# Patient Record
Sex: Male | Born: 1950 | Race: White | Hispanic: No | Marital: Married | Smoking: Current every day smoker
Health system: Southern US, Community
[De-identification: ages and names within clinical notes are randomized; demographics above are authoritative.]

## PROBLEM LIST (undated history)

## (undated) DIAGNOSIS — F1090 Alcohol use, unspecified, uncomplicated: Secondary | ICD-10-CM

## (undated) DIAGNOSIS — Z7289 Other problems related to lifestyle: Secondary | ICD-10-CM

## (undated) DIAGNOSIS — Z789 Other specified health status: Secondary | ICD-10-CM

## (undated) HISTORY — PX: BACK SURGERY: SHX140

---

## 2015-08-02 ENCOUNTER — Encounter (HOSPITAL_COMMUNITY): Payer: Self-pay | Admitting: *Deleted

## 2015-08-02 ENCOUNTER — Emergency Department (HOSPITAL_COMMUNITY): Payer: Medicare Other

## 2015-08-02 ENCOUNTER — Inpatient Hospital Stay (HOSPITAL_COMMUNITY)
Admission: EM | Admit: 2015-08-02 | Discharge: 2015-08-13 | DRG: 432 | Disposition: E | Payer: Medicare Other | Attending: Internal Medicine | Admitting: Internal Medicine

## 2015-08-02 DIAGNOSIS — Z720 Tobacco use: Secondary | ICD-10-CM | POA: Diagnosis not present

## 2015-08-02 DIAGNOSIS — C799 Secondary malignant neoplasm of unspecified site: Secondary | ICD-10-CM | POA: Diagnosis present

## 2015-08-02 DIAGNOSIS — R000 Tachycardia, unspecified: Secondary | ICD-10-CM | POA: Diagnosis present

## 2015-08-02 DIAGNOSIS — C3491 Malignant neoplasm of unspecified part of right bronchus or lung: Secondary | ICD-10-CM | POA: Diagnosis present

## 2015-08-02 DIAGNOSIS — K7460 Unspecified cirrhosis of liver: Secondary | ICD-10-CM | POA: Diagnosis present

## 2015-08-02 DIAGNOSIS — R918 Other nonspecific abnormal finding of lung field: Secondary | ICD-10-CM | POA: Diagnosis not present

## 2015-08-02 DIAGNOSIS — F17210 Nicotine dependence, cigarettes, uncomplicated: Secondary | ICD-10-CM | POA: Diagnosis present

## 2015-08-02 DIAGNOSIS — Z818 Family history of other mental and behavioral disorders: Secondary | ICD-10-CM | POA: Diagnosis not present

## 2015-08-02 DIAGNOSIS — Z66 Do not resuscitate: Secondary | ICD-10-CM | POA: Diagnosis present

## 2015-08-02 DIAGNOSIS — E8770 Fluid overload, unspecified: Secondary | ICD-10-CM | POA: Diagnosis present

## 2015-08-02 DIAGNOSIS — K7031 Alcoholic cirrhosis of liver with ascites: Principal | ICD-10-CM | POA: Diagnosis present

## 2015-08-02 DIAGNOSIS — R0902 Hypoxemia: Secondary | ICD-10-CM

## 2015-08-02 DIAGNOSIS — J9691 Respiratory failure, unspecified with hypoxia: Secondary | ICD-10-CM | POA: Diagnosis present

## 2015-08-02 DIAGNOSIS — R188 Other ascites: Secondary | ICD-10-CM | POA: Diagnosis not present

## 2015-08-02 DIAGNOSIS — Z515 Encounter for palliative care: Secondary | ICD-10-CM | POA: Diagnosis present

## 2015-08-02 DIAGNOSIS — F1010 Alcohol abuse, uncomplicated: Secondary | ICD-10-CM | POA: Diagnosis present

## 2015-08-02 DIAGNOSIS — R1907 Generalized intra-abdominal and pelvic swelling, mass and lump: Secondary | ICD-10-CM

## 2015-08-02 DIAGNOSIS — E871 Hypo-osmolality and hyponatremia: Secondary | ICD-10-CM | POA: Diagnosis present

## 2015-08-02 HISTORY — DX: Alcohol use, unspecified, uncomplicated: F10.90

## 2015-08-02 HISTORY — DX: Other specified health status: Z78.9

## 2015-08-02 HISTORY — DX: Other problems related to lifestyle: Z72.89

## 2015-08-02 LAB — CBC WITH DIFFERENTIAL/PLATELET
BASOS ABS: 0 10*3/uL (ref 0.0–0.1)
BASOS PCT: 0 %
Eosinophils Absolute: 0.1 10*3/uL (ref 0.0–0.7)
Eosinophils Relative: 1 %
HEMATOCRIT: 38 % — AB (ref 39.0–52.0)
Hemoglobin: 13.3 g/dL (ref 13.0–17.0)
Lymphocytes Relative: 24 %
Lymphs Abs: 2.2 10*3/uL (ref 0.7–4.0)
MCH: 37 pg — AB (ref 26.0–34.0)
MCHC: 35 g/dL (ref 30.0–36.0)
MCV: 105.8 fL — AB (ref 78.0–100.0)
MONO ABS: 0.8 10*3/uL (ref 0.1–1.0)
Monocytes Relative: 9 %
NEUTROS ABS: 6 10*3/uL (ref 1.7–7.7)
NEUTROS PCT: 66 %
PLATELETS: 152 10*3/uL (ref 150–400)
RBC: 3.59 MIL/uL — AB (ref 4.22–5.81)
RDW: 15.5 % (ref 11.5–15.5)
WBC: 9.2 10*3/uL (ref 4.0–10.5)

## 2015-08-02 LAB — HEPATIC FUNCTION PANEL
ALK PHOS: 86 U/L (ref 38–126)
ALT: 31 U/L (ref 17–63)
AST: 68 U/L — AB (ref 15–41)
Albumin: 2.5 g/dL — ABNORMAL LOW (ref 3.5–5.0)
BILIRUBIN INDIRECT: 1.2 mg/dL — AB (ref 0.3–0.9)
Bilirubin, Direct: 0.5 mg/dL (ref 0.1–0.5)
TOTAL PROTEIN: 7.3 g/dL (ref 6.5–8.1)
Total Bilirubin: 1.7 mg/dL — ABNORMAL HIGH (ref 0.3–1.2)

## 2015-08-02 LAB — PROTEIN, BODY FLUID: Total protein, fluid: <3 g/dL

## 2015-08-02 LAB — PROTIME-INR
INR: 1.52 — ABNORMAL HIGH (ref 0.00–1.49)
Prothrombin Time: 18.3 seconds — ABNORMAL HIGH (ref 11.6–15.2)

## 2015-08-02 LAB — BASIC METABOLIC PANEL WITH GFR
Anion gap: 6 (ref 5–15)
BUN: 14 mg/dL (ref 6–20)
CO2: 32 mmol/L (ref 22–32)
Calcium: 8.5 mg/dL — ABNORMAL LOW (ref 8.9–10.3)
Chloride: 92 mmol/L — ABNORMAL LOW (ref 101–111)
Creatinine, Ser: 0.5 mg/dL — ABNORMAL LOW (ref 0.61–1.24)
GFR calc Af Amer: >60 mL/min
GFR calc non Af Amer: >60 mL/min
Glucose, Bld: 111 mg/dL — ABNORMAL HIGH (ref 65–99)
Potassium: 5.1 mmol/L (ref 3.5–5.1)
Sodium: 130 mmol/L — ABNORMAL LOW (ref 135–145)

## 2015-08-02 LAB — ALBUMIN, FLUID (OTHER): Albumin, Fluid: <1 g/dL

## 2015-08-02 LAB — GLUCOSE, PERITONEAL FLUID: Glucose, Peritoneal Fluid: 113 mg/dL

## 2015-08-02 LAB — LACTATE DEHYDROGENASE, PLEURAL OR PERITONEAL FLUID: LD FL: 33 U/L — AB (ref 3–23)

## 2015-08-02 LAB — TSH: TSH: 4.138 u[IU]/mL (ref 0.350–4.500)

## 2015-08-02 LAB — TROPONIN I

## 2015-08-02 LAB — BRAIN NATRIURETIC PEPTIDE: B Natriuretic Peptide: 222 pg/mL — ABNORMAL HIGH (ref 0.0–100.0)

## 2015-08-02 MED ORDER — SPIRONOLACTONE 25 MG PO TABS
50.0000 mg | ORAL_TABLET | Freq: Every day | ORAL | Status: DC
  Administered 2015-08-02: 50 mg via ORAL
  Filled 2015-08-02: qty 2

## 2015-08-02 MED ORDER — ALBUTEROL SULFATE (2.5 MG/3ML) 0.083% IN NEBU
2.5000 mg | INHALATION_SOLUTION | Freq: Four times a day (QID) | RESPIRATORY_TRACT | Status: DC | PRN
  Administered 2015-08-03: 5 mg via RESPIRATORY_TRACT
  Filled 2015-08-02: qty 3

## 2015-08-02 MED ORDER — IOPAMIDOL (ISOVUE-370) INJECTION 76%
100.0000 mL | Freq: Once | INTRAVENOUS | Status: AC | PRN
  Administered 2015-08-02: 100 mL via INTRAVENOUS

## 2015-08-02 MED ORDER — SODIUM CHLORIDE 0.9 % IV SOLN: 250.0000 mL | INTRAVENOUS | Status: DC | PRN

## 2015-08-02 MED ORDER — TIOTROPIUM BROMIDE MONOHYDRATE 18 MCG IN CAPS
18.0000 ug | ORAL_CAPSULE | Freq: Every day | RESPIRATORY_TRACT | Status: DC
  Filled 2015-08-02: qty 5

## 2015-08-02 MED ORDER — CHLORDIAZEPOXIDE HCL 25 MG PO CAPS: 25.0000 mg | ORAL_CAPSULE | Freq: Every day | ORAL | Status: DC

## 2015-08-02 MED ORDER — SODIUM CHLORIDE 0.9% FLUSH: 3.0000 mL | Freq: Two times a day (BID) | INTRAVENOUS | Status: DC

## 2015-08-02 MED ORDER — ENOXAPARIN SODIUM 30 MG/0.3ML ~~LOC~~ SOLN
30.0000 mg | Freq: Every day | SUBCUTANEOUS | Status: DC
  Administered 2015-08-02: 30 mg via SUBCUTANEOUS
  Filled 2015-08-02: qty 0.3

## 2015-08-02 MED ORDER — SODIUM CHLORIDE 0.9% FLUSH: 3.0000 mL | INTRAVENOUS | Status: DC | PRN

## 2015-08-02 MED ORDER — SODIUM CHLORIDE 0.9 % IV SOLN
INTRAVENOUS | Status: AC
  Administered 2015-08-02: 23:00:00 via INTRAVENOUS

## 2015-08-02 MED ORDER — CHLORDIAZEPOXIDE HCL 25 MG PO CAPS
50.0000 mg | ORAL_CAPSULE | Freq: Once | ORAL | Status: AC
  Administered 2015-08-02: 50 mg via ORAL
  Filled 2015-08-02: qty 2

## 2015-08-02 NOTE — H&P (Addendum)
TRH H&P   Patient Demographics:    Randy Wang, is a 65 y.o. male  MRN: [id number]   DOB - [birth year]  Admit Date - 07/14/2015  Outpatient Primary MD for the patient is No PCP Per Patient  Referring MD/NP/PA: Nunzio Cory mcmannus  Outpatient Specialists: none  Patient coming from: home  Chief Complaint  Patient presents with  . Leg Swelling  . Cough      HPI:    Randy Wang  is a 65 y.o. male, who has not seen a physician since 1992, when had abscess of the left back, apparently presents due to chronic cough, as well as swelling of his abdomen and lower extremities.     Review of systems:    In addition to the HPI above,  No Fever-chills, No Headache, No changes with Vision or hearing, No problems swallowing food or Liquids, No Chest pain,  No Abdominal pain, No Nausea or Vommitting, Bowel movements are regular,  + abdominal distention No Blood in stool or Urine, No dysuria, No new skin rashes or bruises, No new joints pains-aches,  No new weakness, tingling, numbness in any extremity, No recent weight gain or loss, No polyuria, polydypsia or polyphagia, No significant Mental Stressors.  A full 10 point Review of Systems was done, except as stated above, all other Review of Systems were negative.   With Past History of the following :    Past Medical History  Diagnosis Date  . Alcohol use ([place])     daily      Past Surgical History  Procedure Laterality Date  . Back surgery        Social History:     Social History  Substance Use Topics  . Smoking status: Current Every Day Smoker -- 1.00 packs/day    Types: Cigarettes  . Smokeless tobacco: Not on file  . Alcohol Use: 3.0 oz/week    5 Standard drinks or equivalent per week     Comment: daily     Lives - at home  Mobility -  Walks byself   Family History :     Family History   Problem Relation Age of Onset  . Cirrhosis Mother 54  . Depression Father     suicide      Home Medications:   Prior to Admission medications   Not on File     Allergies:    No Known Allergies   Physical Exam:   Vitals  Blood pressure 159/75, pulse 103, temperature 98 F (36.7 C), temperature source Oral, resp. rate 16, height '5\' 10"'$  (1.778 m), weight 74.844 kg (165 lb), SpO2 96 %.   1. General  lying in bed in NAD,    2. Normal affect and insight, Not Suicidal or Homicidal, Awake Alert, Oriented X 3.  3. No F.N deficits, ALL C.Nerves Intact, Strength 5/5 all 4 extremities, Sensation intact all  4 extremities, Plantars down going.  4. Ears and Eyes appear Normal, Conjunctivae clear, PERRLA. Moist Oral Mucosa.  5. Supple Neck, No JVD, No cervical lymphadenopathy appriciated, No Carotid Bruits.  6. Symmetrical Chest wall movement, slight decrease air movement bilaterally, slight decrease in bs at the right lung base.  No wheeze, no crackles.   7. Borderline tachycardic s1, s2 , No Gallops, Rubs or Murmurs, No Parasternal Heave.  8. + abdominal distention.   Positive Bowel Sounds, Abdomen Soft, No tenderness, No organomegaly appriciated,No rebound -guarding or rigidity.  9.  No Cyanosis, Normal Skin Turgor, No Skin Rash or Bruise.  10. Good muscle tone,  joints appear normal , no effusions, Normal ROM.      Data Review:    CBC  Recent Labs Lab 08/12/2015 1309  WBC 9.2  HGB 13.3  HCT 38.0*  PLT 152  MCV 105.8*  MCH 37.0*  MCHC 35.0  RDW 15.5  LYMPHSABS 2.2  MONOABS 0.8  EOSABS 0.1  BASOSABS 0.0   ------------------------------------------------------------------------------------------------------------------  Chemistries   Recent Labs Lab 07/21/2015 1309  NA 130*  K 5.1  CL 92*  CO2 32  GLUCOSE 111*  BUN 14  CREATININE 0.50*  CALCIUM 8.5*  AST 68*  ALT 31  ALKPHOS 86  BILITOT 1.7*    ------------------------------------------------------------------------------------------------------------------ estimated creatinine clearance is 96.3 mL/min (by C-G formula based on Cr of 0.5). ------------------------------------------------------------------------------------------------------------------ No results for input(s): TSH, T4TOTAL, T3FREE, THYROIDAB in the last 72 hours.  Invalid input(s): FREET3  Coagulation profile  Recent Labs Lab 07/31/2015 1309  INR 1.52*   ------------------------------------------------------------------------------------------------------------------- No results for input(s): DDIMER in the last 72 hours. -------------------------------------------------------------------------------------------------------------------  Cardiac Enzymes  Recent Labs Lab 08/10/2015 1309  TROPONINI <0.03   ------------------------------------------------------------------------------------------------------------------    Component Value Date/Time   BNP 222.0* 07/21/2015 1309     ---------------------------------------------------------------------------------------------------------------  Urinalysis No results found for: COLORURINE, APPEARANCEUR, LABSPEC, PHURINE, GLUCOSEU, HGBUR, BILIRUBINUR, KETONESUR, PROTEINUR, UROBILINOGEN, NITRITE, LEUKOCYTESUR  ----------------------------------------------------------------------------------------------------------------   Imaging Results:    Dg Chest 2 View  08/12/2015  CLINICAL DATA:  Chronic cough EXAM: CHEST  2 VIEW COMPARISON:  None. FINDINGS: There is a focal opacity arising posteriorly in the region of the superior segment of the right lower lobe measuring 7.3 x 7.1 x 4.4 cm. There is an ill-defined area of airspace opacity in anterior segment of the right upper lobe. Lungs elsewhere clear. Heart size and pulmonary vascularity are normal. No adenopathy. There is atherosclerotic calcification in the  aorta. No bone lesions. IMPRESSION: Opacity in the superior segment right lower lobe posteriorly, concerning for parenchymal lung mass, measuring 7.3 x 7.1 x 4.4 cm. Advise contrast enhanced chest CT to further assess. Ill-defined opacity anterior segment right upper lobe, concerning for focal pneumonia. Lungs elsewhere clear. Cardiac silhouette within normal limits. Aortic atherosclerosis. Electronically Signed   By: Lowella Grip III M.D.   On: 08/10/2015 13:26   Ct Angio Chest Pe W/cm &/or Wo Cm  07/18/2015  ADDENDUM REPORT: 07/26/2015 18:40 ADDENDUM: The large peripheral mass in the right lower lobe abutting the pleura is highly concerning for a primary pulmonary malignancy. Electronically Signed   By: Dorise Bullion III M.D   On: 08/06/2015 18:40  08/06/2015  CLINICAL DATA:  Possible lung mass on chest x-ray.  Ascites. EXAM: CT ANGIOGRAPHY CHEST WITH CONTRAST TECHNIQUE: Multidetector CT imaging of the chest was performed using the standard protocol during bolus administration of intravenous contrast. Multiplanar CT image reconstructions and MIPs were obtained to evaluate the vascular anatomy. CONTRAST:  100 mL of Isovue 370 COMPARISON:  Chest x-ray from earlier today FINDINGS: Debris in the trachea could represent significant mucus or aspiration. Recommend clinical correlation. The debris extends into the right mainstem bronchus is well. No pneumothorax. There is a low-density mass abutting the pleura in the right posterior inferior lung measuring 9 by 5 by 7.6 cm. There is underlying pleural thickening. There is irregularity in the underlying posterior right seventh rib. There appears to be lucency extending all even this rib consistent with a nondisplaced fracture. There is a small cavitated nodule in the right upper lobe on coronal image 105 and axial image 37 measuring 8 mm. There is a nodule between the heart in the liver on series 6, image 70 measuring 2.1 cm. It is unclear whether this is in the  lung or mediastinum but favored to be pulmonary. Another nodule is seen in the medial left lower lobe on series 6, image 84 measuring greater than a cm. A tiny nodule seen in the lateral left lung measuring 3 or 4 mm on image 67 of series 6. A final nodule is seen in the left lung base on series 4, image 77. No other suspicious nodules or masses. Adenopathy is identified in the mediastinum and right hilum. An enlarged right paratracheal node on series 4, image 39 measures 2.7 by 3.3 cm. There are shotty nodes in the AP window which are nonspecific but could be early metastatic disease. No left-sided pleural effusion or pericardial effusion. There are coronary artery calcifications. The heart demonstrates no acute abnormalities. Evaluation of the thoracic aorta is limited due to motion but no aneurysm or dissection is identified. No pulmonary emboli. Marked ascites is seen in the upper abdomen. A cirrhotic liver is noted. Further evaluation of the upper abdomen multi described on the abdominal CT from today. No other acute bony abnormalities. Review of the MIP images confirms the above findings. IMPRESSION: 1. No pulmonary emboli. 2. There is a large peripheral mass in the right lower lobe with underlying pleural thickening. There appears to be an underlying rib fracture as described above but this may be pathologic with some irregularity of the cortex. Recommend clinical correlation. 3. Multiple pulmonary nodules suspicious for metastases. 4. Adenopathy in the mediastinum and right hilum consistent with metastatic disease. 5. Cirrhosis and ascites. The abdomen will be better described on the dedicated CT of the abdomen from today. 6. Significant debris in the trachea and right mainstem bronchus may represent aspiration. Electronically Signed: By: Dorise Bullion III M.D On: 07/27/2015 18:23   Ct Abdomen Pelvis W Contrast  08/12/2015  CLINICAL DATA:  Ascites and cirrhosis EXAM: CT ABDOMEN AND PELVIS WITH CONTRAST  TECHNIQUE: Multidetector CT imaging of the abdomen and pelvis was performed using the standard protocol following bolus administration of intravenous contrast. CONTRAST:  100 mL of Isovue 370 COMPARISON:  None. FINDINGS: Again noted is a mass in the periphery of the right lower lobe with cortical irregularity and a nondisplaced fracture in an underlying rib. Other pulmonary nodules in the lung bases were better described on the CT of the chest. Coronary artery calcifications are noted. Suspicious adenopathy in the subcarinal region. No free air. There is severe ascites diffusely throughout the abdomen. There is a nodular shrunken liver consistent cirrhosis. A low-attenuation lesion at the dome with probably a cyst. Another tiny low-attenuation lesion in the left hepatic lobe on image 23 of series 12 is too small to characterize but may simply represent a tiny cyst. No other  suspicious hepatic masses are noted. Cholelithiasis is identified. The spleen is not enlarged by CT criteria. Adrenal glands are unremarkable. Scattered small renal cysts are noted. The pancreas is normal. Atherosclerosis is seen in the non aneurysmal aorta. Shotty nodes in the upper abdomen may be reactive with no adenopathy by CT criteria. The portal vein is unremarkable. Evaluation the bowel is limited due to the lack of oral contrast. Within this limitation, the stomach and small bowel are normal. The colon is also poorly evaluated with no acute colonic abnormalities. Visualized portions of the appendix are normal. The pelvis demonstrates shotty prominent nodes in the inguinal regions, probably reactive. No adenopathy in the pelvis. The bladder is normal. A large left hydrocele is identified, extending through a left inguinal hernia. No other abnormalities seen in the pelvis. Delayed imaging through the upper abdomen demonstrates no filling defects in the upper renal collecting systems. Other than the abnormal rib underlying the right lower  lobe mass, no other bony abnormalities are noted. IMPRESSION: 1. Again noted is a mass in the right lower lobe highly concerning for a primary pulmonary malignancy. The nodules in the lung bases are consistent with metastatic disease. Metastatic adenopathy to the mediastinum. There is a fracture in the rib underlying the primary mass. However, the associated cortical irregularity suggests the possibility of a pathologic fracture. 2. Cirrhosis in the liver. 3. Severe ascites. 4. Atherosclerosis in the abdominal aorta. Electronically Signed   By: Dorise Bullion III M.D   On: 08/01/2015 18:36   US Abdomen Limited  07/22/2015  CLINICAL DATA:  Abdominal swelling EXAM: LIMITED ABDOMEN ULTRASOUND FOR ASCITES TECHNIQUE: Limited ultrasound survey for ascites was performed in all four abdominal quadrants. COMPARISON:  None. FINDINGS: Large volume abdominal ascites. Diminutive size the liver with a slightly nodular contour partially visualized. IMPRESSION: Large volume ascites. Electronically Signed   By: Kathreen Devoid   On: 07/16/2015 16:36   US Paracentesis  07/18/2015  INDICATION: Ascites. EXAM: ULTRASOUND GUIDED PARACENTESIS MEDICATIONS: None. COMPLICATIONS: None immediate. PROCEDURE: Informed written consent was obtained from the patient after a discussion of the risks, benefits and alternatives to treatment. A timeout was performed prior to the initiation of the procedure. Initial ultrasound scanning demonstrates a large amount of ascites within the left lower abdominal quadrant. The right lower abdomen was prepped and draped in the usual sterile fashion. 1% lidocaine with epinephrine was used for local anesthesia. Following this, a 6 French catheter was introduced. An ultrasound image was saved for documentation purposes. The paracentesis was performed. The catheter was removed and a dressing was applied. The patient tolerated the procedure well without immediate post procedural complication. Patient transferred  back to the emergency room in stable condition. Postprocedural orders verbally given to the emergency room nurse time following the procedure. FINDINGS: A total of approximately 2.1 L of clear yellow fluid was removed. Samples were sent to the laboratory as requested by the clinical team. IMPRESSION: Successful ultrasound-guided paracentesis yielding 2.1 L liters of peritoneal fluid. Electronically Signed   By: Marcello Moores  Register   On: 07/29/2015 17:09    EKG: ST at 100, nl axis,     Assessment & Plan:    Active Problems:   Ascites   Lung mass   Hyponatremia   Cirrhosis (HCC)   Tachycardia   Tobacco use    1. Lung mass (right lower lung) with multiple pulmonary nodules suspicious for metastatic disease Oncology consultation, pulmonary consultation Keep on o2 overnite  2. Cirrhosis/ Ascities  ? Related  to ETOH ABUSE Check acute hepatitis panel,consider further w/up w Ferritin, iron, tibc, ceruloplasmin, alpha 1 antitripsin, ana, anti smooth muscle abx, ama, celiac panel GI consultation Awaiting paracentesis results Start on spironolactone '50mg'$  po qday, consider addition of lasix as well.   3. Hyponatremia Check serum osm, tsh, cortisol Check urine sodium, urine osm Check cmp in am  4. Hypoxia, ? Copd vs lung mass (CTA chest=> negative for PE) o2 [place] Start on spiriva 1puff qday.  Albuterol neb q6h prn dyspnea  5.  Tachycardia Check tsh Check cardiac echo r/o cardiomyopathy  6. Tobacco use Pt counselled on smoking cessation x 3 minutes, declines patch for now  7. Etoh abuse Librium '50mg'$  po x1, and then '25mg'$  po qday x1 day  DVT Prophylaxis Lovenox - SCDs  AM Labs Ordered, also please review Full Orders  Family Communication: Admission, patients condition and plan of care including tests being ordered have been discussed with the patient who indicate understanding and agree with the plan and Code Status.  Code Status DNR  Likely DC to    Condition GUARDED     Consults called:  Oncology, pulmonary, GI consults ordered  Admission status: inpatient  Time spent in minutes : 50 minutes   Jani Gravel M.D on 07/18/2015 at 7:12 PM  Between 7am to 7pm - Pager - [phone] After 7pm go to [email or web address] - password [email or web address]  [facility] - Office  [phone]

## 2015-08-02 NOTE — ED Notes (Signed)
Pt comes in for cough and congestion starting x1 week ago. Per family, pt has bilateral leg swelling and abdominal distention starting 1 month ago. Pt is alert and oriented.

## 2015-08-02 NOTE — Progress Notes (Signed)
Patient ID: Randy Wang, male   DOB: [birth year], 65 y.o.   MRN: [id number] US paracentesis performed without difficulty-hemostasis achieved following procedure-pt. Sent back to ER in stable condition

## 2015-08-02 NOTE — ED Notes (Signed)
Pt going to ultrasound.

## 2015-08-02 NOTE — ED Notes (Signed)
No drainage noted from paracentesis site.

## 2015-08-02 NOTE — ED Notes (Signed)
Found pt to have initial O2 sat of 77% on room air with good pleth wave.  Pt placed on 2L of oxygen.

## 2015-08-02 NOTE — ED Notes (Signed)
Pt returned from ultrasound.  No bleeding to bandage on left abdomen.  Pt denies complaints and states he feels better.

## 2015-08-02 NOTE — ED Provider Notes (Signed)
CSN: [id number]     Arrival date & time 07/26/2015  1230 History   First MD Initiated Contact with Patient 07/27/2015 1516     Chief Complaint  Patient presents with  . Leg Swelling  . Cough      HPI  Pt was seen at 1525. Per pt and his family, c/o gradual onset and worsening of persistent "swelling" for the past 6+ weeks and "cough" for the past 2+ weeks. Pt describes his swelling as located in his bilat LE's and abd. Describes his cough as "I'm congested in my chest." Endorses hx of daily etoh use for "years." Pt's family states pt has not been to a doctor "since 1992." Pt states he has cut down his etoh use over the past several weeks and "is just drinking 1/2 to 1 drink a day now." Denies tremors or seizures when he stops drinking. Denies CP/palpitations, no abd pain, no N/V/D, no fevers.     Past Medical History  Diagnosis Date  . Alcohol use ([place])     daily     Past Surgical History  Procedure Laterality Date  . Back surgery      Social History  Substance Use Topics  . Smoking status: Current Every Day Smoker -- 1.00 packs/day    Types: Cigarettes  . Smokeless tobacco: None  . Alcohol Use: Yes     Comment: daily    Review of Systems ROS: Statement: All systems negative except as marked or noted in the HPI; Constitutional: Negative for fever and chills. ; ; Eyes: Negative for eye pain, redness and discharge. ; ; ENMT: Negative for ear pain, hoarseness, nasal congestion, sinus pressure and sore throat. ; ; Cardiovascular: Negative for chest pain, palpitations, diaphoresis. +dyspnea and peripheral edema. ; ; Respiratory: +cough. Negative for wheezing and stridor. ; ; Gastrointestinal: +abd "swelling." Negative for nausea, vomiting, diarrhea, abdominal pain, blood in stool, hematemesis, jaundice and rectal bleeding. . ; ; Genitourinary: Negative for dysuria, flank pain and hematuria. ; ; Musculoskeletal: Negative for back pain and neck pain. Negative for swelling and trauma.; ;  Skin: Negative for pruritus, rash, abrasions, blisters, bruising and skin lesion.; ; Neuro: Negative for headache, lightheadedness and neck stiffness. Negative for weakness, altered level of consciousness, altered mental status, extremity weakness, paresthesias, involuntary movement, seizure and syncope.      Allergies  Review of patient's allergies indicates no known allergies.  Home Medications   Prior to Admission medications   Not on File   BP 119/89 mmHg  Pulse 93  Temp(Src) 98 F (36.7 C) (Oral)  Resp 21  Ht '5\' 10"'$  (1.778 m)  Wt 165 lb (74.844 kg)  BMI 23.68 kg/m2  SpO2 97% Physical Exam  1530: Physical examination:  Nursing notes reviewed; Vital signs and O2 SAT reviewed;  Constitutional: Well developed, Well nourished, Well hydrated, In no acute distress; Head:  Normocephalic, atraumatic; Eyes: EOMI, PERRL, No scleral icterus; ENMT: Mouth and pharynx normal, Mucous membranes moist; Neck: Supple, Full range of motion, No lymphadenopathy; Cardiovascular: Regular rate and rhythm, No gallop; Respiratory: Breath sounds coarse & equal bilaterally, No wheezes.  Speaking full sentences with ease, Normal respiratory effort/excursion; Chest: Nontender, Movement normal; Abdomen: Nontender, +tense, +distended, decreased bowel sounds; Genitourinary: No CVA tenderness; Extremities: Pulses normal, No tenderness,+2 pedal edema bilat with chronic stasis changes. No calf asymmetry.; Neuro: AA&Ox3, Major CN grossly intact.  Speech clear. No gross focal motor or sensory deficits in extremities.; Skin: Color normal, Warm, Dry.   ED Course  Procedures (including critical care time) Labs Review  Imaging Review  I have personally reviewed and evaluated these images and lab results as part of my medical decision-making.   EKG Interpretation   Date/Time:  Friday August 02 2015 13:50:14 EDT Ventricular Rate:  97 PR Interval:    QRS Duration: 100 QT Interval:  341 QTC Calculation: 434 R Axis:    -21 Text Interpretation:  Sinus rhythm Left ventricular hypertrophy Anterior Q  waves, possibly due to LVH Baseline wander Artifact No old tracing to  compare Confirmed by [facility]  MD, Nunzio Cory [phone]) on 07/19/2015 3:38:28 PM      MDM  MDM Reviewed: nursing note and vitals Interpretation: labs, ECG, x-ray, ultrasound and CT scan     Results for orders placed or performed during the hospital encounter of 07/21/2015  Brain natriuretic peptide  Result Value Ref Range   B Natriuretic Peptide 222.0 (H) 0.0 - 100.0 pg/mL  CBC with Differential  Result Value Ref Range   WBC 9.2 4.0 - 10.5 K/uL   RBC 3.59 (L) 4.22 - 5.81 MIL/uL   Hemoglobin 13.3 13.0 - 17.0 g/dL   HCT 38.0 (L) 39.0 - 52.0 %   MCV 105.8 (H) 78.0 - 100.0 fL   MCH 37.0 (H) 26.0 - 34.0 pg   MCHC 35.0 30.0 - 36.0 g/dL   RDW 15.5 11.5 - 15.5 %   Platelets 152 150 - 400 K/uL   Neutrophils Relative % 66 %   Neutro Abs 6.0 1.7 - 7.7 K/uL   Lymphocytes Relative 24 %   Lymphs Abs 2.2 0.7 - 4.0 K/uL   Monocytes Relative 9 %   Monocytes Absolute 0.8 0.1 - 1.0 K/uL   Eosinophils Relative 1 %   Eosinophils Absolute 0.1 0.0 - 0.7 K/uL   Basophils Relative 0 %   Basophils Absolute 0.0 0.0 - 0.1 K/uL  Basic metabolic panel  Result Value Ref Range   Sodium 130 (L) 135 - 145 mmol/L   Potassium 5.1 3.5 - 5.1 mmol/L   Chloride 92 (L) 101 - 111 mmol/L   CO2 32 22 - 32 mmol/L   Glucose, Bld 111 (H) 65 - 99 mg/dL   BUN 14 6 - 20 mg/dL   Creatinine, Ser 0.50 (L) 0.61 - 1.24 mg/dL   Calcium 8.5 (L) 8.9 - 10.3 mg/dL   GFR calc non Af Amer >60 >60 mL/min   GFR calc Af Amer >60 >60 mL/min   Anion gap 6 5 - 15  Troponin I  Result Value Ref Range   Troponin I <0.03 <0.03 ng/mL  Hepatic function panel  Result Value Ref Range   Total Protein 7.3 6.5 - 8.1 g/dL   Albumin 2.5 (L) 3.5 - 5.0 g/dL   AST 68 (H) 15 - 41 U/L   ALT 31 17 - 63 U/L   Alkaline Phosphatase 86 38 - 126 U/L   Total Bilirubin 1.7 (H) 0.3 - 1.2 mg/dL   Bilirubin,  Direct 0.5 0.1 - 0.5 mg/dL   Indirect Bilirubin 1.2 (H) 0.3 - 0.9 mg/dL  Protime-INR  Result Value Ref Range   Prothrombin Time 18.3 (H) 11.6 - 15.2 seconds   INR 1.52 (H) 0.00 - 1.49   Dg Chest 2 View 07/14/2015  CLINICAL DATA:  Chronic cough EXAM: CHEST  2 VIEW COMPARISON:  None. FINDINGS: There is a focal opacity arising posteriorly in the region of the superior segment of the right lower lobe measuring 7.3 x 7.1 x 4.4 cm. There  is an ill-defined area of airspace opacity in anterior segment of the right upper lobe. Lungs elsewhere clear. Heart size and pulmonary vascularity are normal. No adenopathy. There is atherosclerotic calcification in the aorta. No bone lesions. IMPRESSION: Opacity in the superior segment right lower lobe posteriorly, concerning for parenchymal lung mass, measuring 7.3 x 7.1 x 4.4 cm. Advise contrast enhanced chest CT to further assess. Ill-defined opacity anterior segment right upper lobe, concerning for focal pneumonia. Lungs elsewhere clear. Cardiac silhouette within normal limits. Aortic atherosclerosis. Electronically Signed   By: Lowella Grip III M.D.   On: 08/06/2015 13:26   Ct Angio Chest Pe W/cm &/or Wo Cm 08/10/2015  CLINICAL DATA:  Possible lung mass on chest x-ray.  Ascites. EXAM: CT ANGIOGRAPHY CHEST WITH CONTRAST TECHNIQUE: Multidetector CT imaging of the chest was performed using the standard protocol during bolus administration of intravenous contrast. Multiplanar CT image reconstructions and MIPs were obtained to evaluate the vascular anatomy. CONTRAST:  100 mL of Isovue 370 COMPARISON:  Chest x-ray from earlier today FINDINGS: Debris in the trachea could represent significant mucus or aspiration. Recommend clinical correlation. The debris extends into the right mainstem bronchus is well. No pneumothorax. There is a low-density mass abutting the pleura in the right posterior inferior lung measuring 9 by 5 by 7.6 cm. There is underlying pleural thickening.  There is irregularity in the underlying posterior right seventh rib. There appears to be lucency extending all even this rib consistent with a nondisplaced fracture. There is a small cavitated nodule in the right upper lobe on coronal image 105 and axial image 37 measuring 8 mm. There is a nodule between the heart in the liver on series 6, image 70 measuring 2.1 cm. It is unclear whether this is in the lung or mediastinum but favored to be pulmonary. Another nodule is seen in the medial left lower lobe on series 6, image 84 measuring greater than a cm. A tiny nodule seen in the lateral left lung measuring 3 or 4 mm on image 67 of series 6. A final nodule is seen in the left lung base on series 4, image 77. No other suspicious nodules or masses. Adenopathy is identified in the mediastinum and right hilum. An enlarged right paratracheal node on series 4, image 39 measures 2.7 by 3.3 cm. There are shotty nodes in the AP window which are nonspecific but could be early metastatic disease. No left-sided pleural effusion or pericardial effusion. There are coronary artery calcifications. The heart demonstrates no acute abnormalities. Evaluation of the thoracic aorta is limited due to motion but no aneurysm or dissection is identified. No pulmonary emboli. Marked ascites is seen in the upper abdomen. A cirrhotic liver is noted. Further evaluation of the upper abdomen multi described on the abdominal CT from today. No other acute bony abnormalities. Review of the MIP images confirms the above findings. IMPRESSION: 1. No pulmonary emboli. 2. There is a large peripheral mass in the right lower lobe with underlying pleural thickening. There appears to be an underlying rib fracture as described above but this may be pathologic with some irregularity of the cortex. Recommend clinical correlation. 3. Multiple pulmonary nodules suspicious for metastases. 4. Adenopathy in the mediastinum and right hilum consistent with metastatic  disease. 5. Cirrhosis and ascites. The abdomen will be better described on the dedicated CT of the abdomen from today. 6. Significant debris in the trachea and right mainstem bronchus may represent aspiration. Electronically Signed   By: Dorise Bullion III  M.D   On: 08/09/2015 18:23   Ct Abdomen Pelvis W Contrast 07/14/2015  CLINICAL DATA:  Ascites and cirrhosis EXAM: CT ABDOMEN AND PELVIS WITH CONTRAST TECHNIQUE: Multidetector CT imaging of the abdomen and pelvis was performed using the standard protocol following bolus administration of intravenous contrast. CONTRAST:  100 mL of Isovue 370 COMPARISON:  None. FINDINGS: Again noted is a mass in the periphery of the right lower lobe with cortical irregularity and a nondisplaced fracture in an underlying rib. Other pulmonary nodules in the lung bases were better described on the CT of the chest. Coronary artery calcifications are noted. Suspicious adenopathy in the subcarinal region. No free air. There is severe ascites diffusely throughout the abdomen. There is a nodular shrunken liver consistent cirrhosis. A low-attenuation lesion at the dome with probably a cyst. Another tiny low-attenuation lesion in the left hepatic lobe on image 23 of series 12 is too small to characterize but may simply represent a tiny cyst. No other suspicious hepatic masses are noted. Cholelithiasis is identified. The spleen is not enlarged by CT criteria. Adrenal glands are unremarkable. Scattered small renal cysts are noted. The pancreas is normal. Atherosclerosis is seen in the non aneurysmal aorta. Shotty nodes in the upper abdomen may be reactive with no adenopathy by CT criteria. The portal vein is unremarkable. Evaluation the bowel is limited due to the lack of oral contrast. Within this limitation, the stomach and small bowel are normal. The colon is also poorly evaluated with no acute colonic abnormalities. Visualized portions of the appendix are normal. The pelvis demonstrates  shotty prominent nodes in the inguinal regions, probably reactive. No adenopathy in the pelvis. The bladder is normal. A large left hydrocele is identified, extending through a left inguinal hernia. No other abnormalities seen in the pelvis. Delayed imaging through the upper abdomen demonstrates no filling defects in the upper renal collecting systems. Other than the abnormal rib underlying the right lower lobe mass, no other bony abnormalities are noted. IMPRESSION: 1. Again noted is a mass in the right lower lobe highly concerning for a primary pulmonary malignancy. The nodules in the lung bases are consistent with metastatic disease. Metastatic adenopathy to the mediastinum. There is a fracture in the rib underlying the primary mass. However, the associated cortical irregularity suggests the possibility of a pathologic fracture. 2. Cirrhosis in the liver. 3. Severe ascites. 4. Atherosclerosis in the abdominal aorta. Electronically Signed   By: Dorise Bullion III M.D   On: 08/01/2015 18:36   US Abdomen Limited 08/12/2015  CLINICAL DATA:  Abdominal swelling EXAM: LIMITED ABDOMEN ULTRASOUND FOR ASCITES TECHNIQUE: Limited ultrasound survey for ascites was performed in all four abdominal quadrants. COMPARISON:  None. FINDINGS: Large volume abdominal ascites. Diminutive size the liver with a slightly nodular contour partially visualized. IMPRESSION: Large volume ascites. Electronically Signed   By: Kathreen Devoid   On: 08/01/2015 16:36   US Paracentesis 08/06/2015  INDICATION: Ascites. EXAM: ULTRASOUND GUIDED PARACENTESIS MEDICATIONS: None. COMPLICATIONS: None immediate. PROCEDURE: Informed written consent was obtained from the patient after a discussion of the risks, benefits and alternatives to treatment. A timeout was performed prior to the initiation of the procedure. Initial ultrasound scanning demonstrates a large amount of ascites within the left lower abdominal quadrant. The right lower abdomen was prepped  and draped in the usual sterile fashion. 1% lidocaine with epinephrine was used for local anesthesia. Following this, a 6 French catheter was introduced. An ultrasound image was saved for documentation purposes. The paracentesis was  performed. The catheter was removed and a dressing was applied. The patient tolerated the procedure well without immediate post procedural complication. Patient transferred back to the emergency room in stable condition. Postprocedural orders verbally given to the emergency room nurse time following the procedure. FINDINGS: A total of approximately 2.1 L of clear yellow fluid was removed. Samples were sent to the laboratory as requested by the clinical team. IMPRESSION: Successful ultrasound-guided paracentesis yielding 2.1 L liters of peritoneal fluid. Electronically Signed   By: Marcello Moores  Register   On: 07/23/2015 17:09     1845:  Pt's O2 Sats 77% on R/A on arrival; improving to 96-100% after O2 N/C and paracentesis. CT C/A/P without pneumonia but question CA with mets. Dx and testing d/w pt and family.  Multiple questions answered.  Verb understanding, agreeable to admit.  T/C to [facility] Dr. Maudie Mercury, case discussed, including:  HPI, pertinent PM/SHx, VS/PE, dx testing, ED course and treatment:  Agreeable to admit, requests to write temporary orders, obtain inpt tele bed to team APAdmits.     Francine Graven, DO 2015-08-15 2220

## 2015-08-02 NOTE — ED Notes (Signed)
Pt taken to CT scan on stretcher

## 2015-08-02 NOTE — ED Notes (Signed)
No drainage noted from paracentesis site noted.

## 2015-08-03 ENCOUNTER — Inpatient Hospital Stay (HOSPITAL_COMMUNITY): Payer: Medicare Other

## 2015-08-03 DIAGNOSIS — K7031 Alcoholic cirrhosis of liver with ascites: Principal | ICD-10-CM

## 2015-08-03 DIAGNOSIS — R188 Other ascites: Secondary | ICD-10-CM

## 2015-08-03 MED ORDER — SODIUM CHLORIDE 0.9 % IV SOLN
2.0000 mg/h | INTRAVENOUS | Status: DC
  Administered 2015-08-03: 2 mg/h via INTRAVENOUS
  Filled 2015-08-03: qty 10

## 2015-08-03 MED ORDER — MORPHINE SULFATE (PF) 2 MG/ML IV SOLN
INTRAVENOUS | Status: AC
  Administered 2015-08-03: 6 mg via INTRAVENOUS
  Filled 2015-08-03: qty 3

## 2015-08-03 MED ORDER — SODIUM CHLORIDE 0.9 % IV SOLN: INTRAVENOUS | Status: DC

## 2015-08-03 MED ORDER — SODIUM POLYSTYRENE SULFONATE 15 GM/60ML PO SUSP: 15.0000 g | Freq: Once | ORAL | Status: DC

## 2015-08-03 MED ORDER — FUROSEMIDE 40 MG PO TABS: 40.0000 mg | ORAL_TABLET | Freq: Every day | ORAL | Status: DC

## 2015-08-03 MED ORDER — FUROSEMIDE 10 MG/ML IJ SOLN: 40.0000 mg | Freq: Two times a day (BID) | INTRAMUSCULAR | Status: DC

## 2015-08-03 MED ORDER — MORPHINE SULFATE 25 MG/ML IV SOLN
INTRAVENOUS | Status: AC
  Filled 2015-08-03: qty 10

## 2015-08-03 MED ORDER — MORPHINE SULFATE (PF) 4 MG/ML IV SOLN: 6.0000 mg | INTRAVENOUS | Status: AC

## 2015-08-03 MED ORDER — CETYLPYRIDINIUM CHLORIDE 0.05 % MT LIQD: 7.0000 mL | Freq: Two times a day (BID) | OROMUCOSAL | Status: DC

## 2015-08-07 LAB — CULTURE, BODY FLUID-BOTTLE

## 2015-08-07 LAB — CULTURE, BODY FLUID W GRAM STAIN -BOTTLE: Culture: NO GROWTH

## 2015-08-13 NOTE — Progress Notes (Signed)
CTSP re SOB and tachypnea. Chart reviewed, patient seen, discussed with wife. Was admitted with ascites and large lung mass suspicious for Lung CA. He had paracentesis.  Unfortunately, he decompensated quickly, and after speaking with his wife, he best served with comfort measure, and she agreed. Morphine bolus and drip will be given.  His death is imminent.   Orvan Falconer MD FACP. Hospitalist.

## 2015-08-13 NOTE — Progress Notes (Signed)
PROGRESS NOTE                                                                                                                                                                                                             Patient Demographics:    Randy Wang, is a 65 y.o. male, DOB - 1950/03/13, VVO:[id number]  Admit date - 07/21/2015   Admitting Physician Jani Gravel, MD  Outpatient Primary MD for the patient is No PCP Per Patient  LOS - 1  Chief Complaint  Patient presents with  . Leg Swelling  . Cough       Brief Narrative    Randy Wang is a 65 y.o. male, who has not seen a physician since 1992  when he had an abscess of the left back, apparently presented to the ER on 07/29/2015 evening due to chronic cough, as well as swelling of his abdomen and lower extremities. Workup in the ER which included CT chest was suspicious for large lung mass suspicious for lung cancer with metastasis, he also had evidence of massive ascites, he has history of severe alcohol abuse and had not seen a physician in over 15 years.  He underwent therapeutic and diagnostic 2 L paracentesis in the ER, subsequently he became progressively more hypoxic, he was seen by the night physician on call and after detailed discussions with the family it was decided that patient most likely has undiagnosed but possible lung cancer with metastasis along with alcoholic cirrhosis with large ascites, patient's family including wife sided patient's wishes of being DO NOT RESUSCITATE and to be transitioned to comfort care. They did not want any aggressive measures or treatments. If he survives pulmonary and oncology consult. At nighttime he was transitioned to morphine drip with goal of care being comfort.     Subjective:    Randy Wang today Is in bed, wearing nonrebreather mask and somnolent, unable to answer questions or follow commands.    Assessment  &  Plan :     1.Cough with hypoxic respiratory failure. Chest CT suspicious for lung malignancy with metastasis. After admission he developed progressive respiratory failure and respiratory distress, he was seen by the night physician on call and after detailed discussions with the family it was decided that patient most likely has undiagnosed but possible lung cancer with metastasis along with  alcoholic cirrhosis with large ascites, patient's family including wife sided patient's wishes of being DO NOT RESUSCITATE and to be transitioned to comfort care. They did not want any aggressive measures or treatments. If he survives pulmonary and oncology consult. At nighttime he was transitioned to morphine drip with goal of care being comfort. Death looks imminent.  2. Crrhosis with large ascites. Likely alcoholic cirrhosis. 2 L therapeutic paracentesis done yesterday however fluid studies were not ordered. For now to put care as above.   3. Hyponatremia due to fluid overload. On Lasix.  4. Alcohol and tobacco abuse. On Librium, nicotine patch. Counsel if he is more alert.    Family Communication  :  Wife and family  Code Status :  DNR, death looks imminent  Diet :  NPO except Meds  Disposition Plan  :  Stay inpatient  Consults  :  Oncology, pulmonary  Procedures  :  CT chest suspicious for lung mass with metastasis  DVT Prophylaxis  :  Lovenox   Lab Results  Component Value Date   PLT 152 08/06/2015    Inpatient Medications  Scheduled Meds: . [START ON 08/04/2015] antiseptic oral rinse  7 mL Mouth Rinse BID  . chlordiazePOXIDE  25 mg Oral Daily  . enoxaparin (LOVENOX) injection  30 mg Subcutaneous QHS  . furosemide  40 mg Oral Daily  . sodium chloride flush  3 mL Intravenous Q12H  . sodium chloride flush  3 mL Intravenous Q12H  . sodium polystyrene  15 g Oral Once  . tiotropium  18 mcg Inhalation Daily   Continuous Infusions: . morphine 2 mg/hr (Aug 06, 2015 0647)   PRN Meds:.sodium  chloride, albuterol, sodium chloride flush  Antibiotics  :    Anti-infectives    None         Objective:   Filed Vitals:   07/14/2015 2000 08/07/2015 2145 2015/08/06 0550 06-Aug-2015 0615  BP: 136/74 138/73 160/94   Pulse: 95 93 111   Temp:  97.9 F (36.6 C) 97.9 F (36.6 C)   TempSrc:  Oral Oral   Resp:  21 22   Height:      Weight:  89.858 kg (198 lb 1.6 oz) 90.2 kg (198 lb 13.7 oz)   SpO2: 98% 96% 93% 93%    Wt Readings from Last 3 Encounters:  08-06-15 90.2 kg (198 lb 13.7 oz)     Intake/Output Summary (Last 24 hours) at 08-06-15 1016 Last data filed at 2015-08-06 0223  Gross per 24 hour  Intake    222 ml  Output    500 ml  Net   -278 ml     Physical Exam  Somolent, No new F.N deficits,   [place].AT,PERRAL Supple Neck,No JVD, No cervical lymphadenopathy appriciated.  Symmetrical Chest wall movement, Good air movement bilaterally, Coarse B. sounds RRR,No Gallops,Rubs or new Murmurs, No Parasternal Heave +ve B.Sounds, Abd Soft but ++ distended, No tenderness, No organomegaly appriciated, No rebound - guarding or rigidity. No Cyanosis, Clubbing , 2+  edema, No new Rash or bruise       Data Review:    CBC  Recent Labs Lab 07/16/2015 1309  WBC 9.2  HGB 13.3  HCT 38.0*  PLT 152  MCV 105.8*  MCH 37.0*  MCHC 35.0  RDW 15.5  LYMPHSABS 2.2  MONOABS 0.8  EOSABS 0.1  BASOSABS 0.0    Chemistries   Recent Labs Lab 08/01/2015 1309  NA 130*  K 5.1  CL 92*  CO2 32  GLUCOSE  111*  BUN 14  CREATININE 0.50*  CALCIUM 8.5*  AST 68*  ALT 31  ALKPHOS 86  BILITOT 1.7*   ------------------------------------------------------------------------------------------------------------------ No results for input(s): CHOL, HDL, LDLCALC, TRIG, CHOLHDL, LDLDIRECT in the last 72 hours.  No results found for: HGBA1C ------------------------------------------------------------------------------------------------------------------  Recent Labs  08/01/2015 1309  TSH 4.138    ------------------------------------------------------------------------------------------------------------------ No results for input(s): VITAMINB12, FOLATE, FERRITIN, TIBC, IRON, RETICCTPCT in the last 72 hours.  Coagulation profile  Recent Labs Lab 07/17/2015 1309  INR 1.52*    No results for input(s): DDIMER in the last 72 hours.  Cardiac Enzymes  Recent Labs Lab 08/04/2015 1309  TROPONINI <0.03   ------------------------------------------------------------------------------------------------------------------    Component Value Date/Time   BNP 222.0* 07/30/2015 1309    Micro Results No results found for this or any previous visit (from the past 240 hour(s)).  Radiology Reports Dg Chest 2 View  07/22/2015  CLINICAL DATA:  Chronic cough EXAM: CHEST  2 VIEW COMPARISON:  None. FINDINGS: There is a focal opacity arising posteriorly in the region of the superior segment of the right lower lobe measuring 7.3 x 7.1 x 4.4 cm. There is an ill-defined area of airspace opacity in anterior segment of the right upper lobe. Lungs elsewhere clear. Heart size and pulmonary vascularity are normal. No adenopathy. There is atherosclerotic calcification in the aorta. No bone lesions. IMPRESSION: Opacity in the superior segment right lower lobe posteriorly, concerning for parenchymal lung mass, measuring 7.3 x 7.1 x 4.4 cm. Advise contrast enhanced chest CT to further assess. Ill-defined opacity anterior segment right upper lobe, concerning for focal pneumonia. Lungs elsewhere clear. Cardiac silhouette within normal limits. Aortic atherosclerosis. Electronically Signed   By: Lowella Grip III M.D.   On: 07/16/2015 13:26   Ct Angio Chest Pe W/cm &/or Wo Cm  08/01/2015  ADDENDUM REPORT: 07/29/2015 18:40 ADDENDUM: The large peripheral mass in the right lower lobe abutting the pleura is highly concerning for a primary pulmonary malignancy. Electronically Signed   By: Dorise Bullion III M.D   On:  07/14/2015 18:40  07/22/2015  CLINICAL DATA:  Possible lung mass on chest x-ray.  Ascites. EXAM: CT ANGIOGRAPHY CHEST WITH CONTRAST TECHNIQUE: Multidetector CT imaging of the chest was performed using the standard protocol during bolus administration of intravenous contrast. Multiplanar CT image reconstructions and MIPs were obtained to evaluate the vascular anatomy. CONTRAST:  100 mL of Isovue 370 COMPARISON:  Chest x-ray from earlier today FINDINGS: Debris in the trachea could represent significant mucus or aspiration. Recommend clinical correlation. The debris extends into the right mainstem bronchus is well. No pneumothorax. There is a low-density mass abutting the pleura in the right posterior inferior lung measuring 9 by 5 by 7.6 cm. There is underlying pleural thickening. There is irregularity in the underlying posterior right seventh rib. There appears to be lucency extending all even this rib consistent with a nondisplaced fracture. There is a small cavitated nodule in the right upper lobe on coronal image 105 and axial image 37 measuring 8 mm. There is a nodule between the heart in the liver on series 6, image 70 measuring 2.1 cm. It is unclear whether this is in the lung or mediastinum but favored to be pulmonary. Another nodule is seen in the medial left lower lobe on series 6, image 84 measuring greater than a cm. A tiny nodule seen in the lateral left lung measuring 3 or 4 mm on image 67 of series 6. A final nodule is seen in the  left lung base on series 4, image 77. No other suspicious nodules or masses. Adenopathy is identified in the mediastinum and right hilum. An enlarged right paratracheal node on series 4, image 39 measures 2.7 by 3.3 cm. There are shotty nodes in the AP window which are nonspecific but could be early metastatic disease. No left-sided pleural effusion or pericardial effusion. There are coronary artery calcifications. The heart demonstrates no acute abnormalities. Evaluation of  the thoracic aorta is limited due to motion but no aneurysm or dissection is identified. No pulmonary emboli. Marked ascites is seen in the upper abdomen. A cirrhotic liver is noted. Further evaluation of the upper abdomen multi described on the abdominal CT from today. No other acute bony abnormalities. Review of the MIP images confirms the above findings. IMPRESSION: 1. No pulmonary emboli. 2. There is a large peripheral mass in the right lower lobe with underlying pleural thickening. There appears to be an underlying rib fracture as described above but this may be pathologic with some irregularity of the cortex. Recommend clinical correlation. 3. Multiple pulmonary nodules suspicious for metastases. 4. Adenopathy in the mediastinum and right hilum consistent with metastatic disease. 5. Cirrhosis and ascites. The abdomen will be better described on the dedicated CT of the abdomen from today. 6. Significant debris in the trachea and right mainstem bronchus may represent aspiration. Electronically Signed: By: Dorise Bullion III M.D On: 07/20/2015 18:23   Ct Abdomen Pelvis W Contrast  07/28/2015  CLINICAL DATA:  Ascites and cirrhosis EXAM: CT ABDOMEN AND PELVIS WITH CONTRAST TECHNIQUE: Multidetector CT imaging of the abdomen and pelvis was performed using the standard protocol following bolus administration of intravenous contrast. CONTRAST:  100 mL of Isovue 370 COMPARISON:  None. FINDINGS: Again noted is a mass in the periphery of the right lower lobe with cortical irregularity and a nondisplaced fracture in an underlying rib. Other pulmonary nodules in the lung bases were better described on the CT of the chest. Coronary artery calcifications are noted. Suspicious adenopathy in the subcarinal region. No free air. There is severe ascites diffusely throughout the abdomen. There is a nodular shrunken liver consistent cirrhosis. A low-attenuation lesion at the dome with probably a cyst. Another tiny  low-attenuation lesion in the left hepatic lobe on image 23 of series 12 is too small to characterize but may simply represent a tiny cyst. No other suspicious hepatic masses are noted. Cholelithiasis is identified. The spleen is not enlarged by CT criteria. Adrenal glands are unremarkable. Scattered small renal cysts are noted. The pancreas is normal. Atherosclerosis is seen in the non aneurysmal aorta. Shotty nodes in the upper abdomen may be reactive with no adenopathy by CT criteria. The portal vein is unremarkable. Evaluation the bowel is limited due to the lack of oral contrast. Within this limitation, the stomach and small bowel are normal. The colon is also poorly evaluated with no acute colonic abnormalities. Visualized portions of the appendix are normal. The pelvis demonstrates shotty prominent nodes in the inguinal regions, probably reactive. No adenopathy in the pelvis. The bladder is normal. A large left hydrocele is identified, extending through a left inguinal hernia. No other abnormalities seen in the pelvis. Delayed imaging through the upper abdomen demonstrates no filling defects in the upper renal collecting systems. Other than the abnormal rib underlying the right lower lobe mass, no other bony abnormalities are noted. IMPRESSION: 1. Again noted is a mass in the right lower lobe highly concerning for a primary pulmonary malignancy. The nodules  in the lung bases are consistent with metastatic disease. Metastatic adenopathy to the mediastinum. There is a fracture in the rib underlying the primary mass. However, the associated cortical irregularity suggests the possibility of a pathologic fracture. 2. Cirrhosis in the liver. 3. Severe ascites. 4. Atherosclerosis in the abdominal aorta. Electronically Signed   By: Dorise Bullion III M.D   On: 07/28/2015 18:36   US Abdomen Limited  08/08/2015  CLINICAL DATA:  Abdominal swelling EXAM: LIMITED ABDOMEN ULTRASOUND FOR ASCITES TECHNIQUE: Limited  ultrasound survey for ascites was performed in all four abdominal quadrants. COMPARISON:  None. FINDINGS: Large volume abdominal ascites. Diminutive size the liver with a slightly nodular contour partially visualized. IMPRESSION: Large volume ascites. Electronically Signed   By: Kathreen Devoid   On: 07/24/2015 16:36   US Paracentesis  08/10/2015  INDICATION: Ascites. EXAM: ULTRASOUND GUIDED PARACENTESIS MEDICATIONS: None. COMPLICATIONS: None immediate. PROCEDURE: Informed written consent was obtained from the patient after a discussion of the risks, benefits and alternatives to treatment. A timeout was performed prior to the initiation of the procedure. Initial ultrasound scanning demonstrates a large amount of ascites within the left lower abdominal quadrant. The right lower abdomen was prepped and draped in the usual sterile fashion. 1% lidocaine with epinephrine was used for local anesthesia. Following this, a 6 French catheter was introduced. An ultrasound image was saved for documentation purposes. The paracentesis was performed. The catheter was removed and a dressing was applied. The patient tolerated the procedure well without immediate post procedural complication. Patient transferred back to the emergency room in stable condition. Postprocedural orders verbally given to the emergency room nurse time following the procedure. FINDINGS: A total of approximately 2.1 L of clear yellow fluid was removed. Samples were sent to the laboratory as requested by the clinical team. IMPRESSION: Successful ultrasound-guided paracentesis yielding 2.1 L liters of peritoneal fluid. Electronically Signed   By: Marcello Moores  Register   On: 07/25/2015 17:09    Time Spent in minutes  30   Lala Lund K M.D on 08-20-2015 at 10:16 AM  Between 7am to 7pm - Pager - [phone]  After 7pm go to [email or web address] - password [facility]  [facility] -  Office  [phone]

## 2015-08-13 NOTE — Progress Notes (Signed)
Upon going into round on the patient while the tech was starting to get vitals signs patient was noted to be very tachnypeic and cyanotic.  He remained alert and able to speak in small sentences.  Lungs sounds were crackles throughout and into the upper airway.  Respiratory called to assess patient and he was able to NTS patient and help clear some of the secretions due to patient's weak cough reflex.  Dr. Marin Comment notified via phone and asked to come to the bedside to assess the patient.  Dr. Marin Comment arrived at the bedside see his note for further explanation.  Orders were given and placed in to [facility] and carried out.  Patient appears more comfortable after morphine bolus given.  Wife given emotional support at the bedside.

## 2015-08-13 NOTE — Progress Notes (Signed)
Late entry:  At approximately 10:00 this AM I was alerted by central tele of severe drop in heart rate.  I arrived at the bedside to find pt unresponsive with no visible signs of respirations, very faint heart sounds and cyanotic extremities.  Explained findings to wife and family who were all at bedside.  At 10:12 pt was unresponsive, with no respirations, no heart rate, cyanosis and death was pronounced.  Informed wife and family of pt's death.

## 2015-08-13 NOTE — Discharge Summary (Signed)
[facility] Death Note                                                                                                                                                                                               [name] ONG:[id number] DOB: November 15, 1950 DOA: Aug 04, 2015  PCP: No PCP Per Patient  Admit date: Aug 04, 2015 Date of Death:  08-05-15      Time of Death: 03-26-22   Pronounced By - RN Notification: No PCP Per Patient notified of death of August 05, 2015   History of present illness:  Randy Wang is a 65 y.o. male with a history of -  65 y.o. male, who has not seen a physician since 1990/03/26  when he had an abscess of the left back, apparently presented to the ER on 08-04-15 evening due to chronic cough, as well as swelling of his abdomen and lower extremities. Workup in the ER which included CT chest was suspicious for large lung mass suspicious for lung cancer with metastasis, he also had evidence of massive ascites, he has history of severe alcohol abuse and had not seen a physician in over 15 years.  He underwent therapeutic and diagnostic 2 L paracentesis in the ER, subsequently he became progressively more hypoxic, he was seen by the night physician on call and after detailed discussions with the family it was decided that patient most likely has undiagnosed but possible lung cancer with metastasis along with alcoholic cirrhosis with large ascites, patient's family including wife sided patient's wishes of being DO NOT RESUSCITATE and to be transitioned to comfort care. They did not want any aggressive measures or treatments. If he survives pulmonary and oncology consult. At nighttime he was transitioned to morphine drip with goal of care being comfort.    Final Diagnoses:  1.  Previously Undiagnosed Lung mass suspicious for cancer with metastasis.  2.  Ascites.  3. Likely previously undiagnosed  alcoholic cirrhosis.  Was full comfort care on morphine drip.  Signature  Thurnell Lose M.D on 2015-08-05 at 10:44 AM  [facility]  Office Phone [phone]  Total clinical and documentation time for today Under 30 minutes   Last Note                                                                     PROGRESS NOTE  Patient Demographics:    Randy Wang, is a 65 y.o. male, DOB - Jul 12, 1950, ZOX:[id number]  Admit date - 07/18/2015   Admitting Physician Jani Gravel, MD  Outpatient Primary MD for the patient is No PCP Per Patient  LOS - 1  Chief Complaint  Patient presents with  . Leg Swelling  . Cough       Brief Narrative    Randy Wang is a 65 y.o. male, who has not seen a physician since 1992  when he had an abscess of the left back, apparently presented to the ER on 08/12/2015 evening due to chronic cough, as well as swelling of his abdomen and lower extremities. Workup in the ER which included CT chest was suspicious for large lung mass suspicious for lung cancer with metastasis, he also had evidence of massive ascites, he has history of severe alcohol abuse and had not seen a physician in over 15 years.  He underwent therapeutic and diagnostic 2 L paracentesis in the ER, subsequently he became progressively more hypoxic, he was seen by the night physician on call and after detailed discussions with the family it was decided that patient most likely has undiagnosed but possible lung cancer with metastasis along with alcoholic cirrhosis with large ascites, patient's family including wife sided patient's wishes of being DO NOT RESUSCITATE and to be transitioned to comfort care. They did not want any aggressive measures or treatments. If he survives pulmonary and  oncology consult. At nighttime he was transitioned to morphine drip with goal of care being comfort.     Subjective:    [name] today Is in bed, wearing nonrebreather mask and somnolent, unable to answer questions or follow commands.    Assessment  & Plan :     1.Cough with hypoxic respiratory failure. Chest CT suspicious for lung malignancy with metastasis. After admission he developed progressive respiratory failure and respiratory distress, he was seen by the night physician on call and after detailed discussions with the family it was decided that patient most likely has undiagnosed but possible lung cancer with metastasis along with alcoholic cirrhosis with large ascites, patient's family including wife sided patient's wishes of being DO NOT RESUSCITATE and to be transitioned to comfort care. They did not want any aggressive measures or treatments. If he survives pulmonary and oncology consult. At nighttime he was transitioned to morphine drip with goal of care being comfort. Death looks imminent.  2. Crrhosis with large ascites. Likely alcoholic cirrhosis. 2 L therapeutic paracentesis done yesterday however fluid studies were not ordered. For now to put care as above.   3. Hyponatremia due to fluid overload. On Lasix.  4. Alcohol and tobacco abuse. On Librium, nicotine patch. Counsel if he is more alert.    Family Communication  :  Wife and family  Code Status :  DNR, death looks imminent  Diet :  NPO except Meds  Disposition Plan  :  Stay inpatient  Consults  :  Oncology, pulmonary  Procedures  :  CT chest suspicious for lung mass with metastasis  DVT Prophylaxis  :  Lovenox   Lab Results  Component Value Date   PLT 152 08/04/2015    Inpatient Medications  Scheduled Meds: . [START ON 08/04/2015] antiseptic oral rinse  7 mL Mouth Rinse BID  . chlordiazePOXIDE  25 mg Oral Daily  . enoxaparin (LOVENOX) injection  30 mg Subcutaneous QHS  . furosemide  40 mg  Intravenous BID  . sodium chloride flush  3 mL Intravenous Q12H  . sodium chloride flush  3 mL Intravenous Q12H  . sodium polystyrene  15 g Oral Once  . tiotropium  18 mcg Inhalation Daily   Continuous Infusions: . morphine 2 mg/hr (08/09/15 0647)   PRN Meds:.sodium chloride, albuterol, sodium chloride flush  Antibiotics  :    Anti-infectives    None         Objective:   Filed Vitals:   07/28/2015 2000 08/01/2015 2145 Aug 09, 2015 0550 08/09/2015 0615  BP: 136/74 138/73 160/94   Pulse: 95 93 111   Temp:  97.9 F (36.6 C) 97.9 F (36.6 C)   TempSrc:  Oral Oral   Resp:  21 22   Height:      Weight:  89.858 kg (198 lb 1.6 oz) 90.2 kg (198 lb 13.7 oz)   SpO2: 98% 96% 93% 93%    Wt Readings from Last 3 Encounters:  08-09-2015 90.2 kg (198 lb 13.7 oz)     Intake/Output Summary (Last 24 hours) at 09-Aug-2015 1044 Last data filed at 09-Aug-2015 0223  Gross per 24 hour  Intake    222 ml  Output    500 ml  Net   -278 ml     Physical Exam  Somolent, No new F.N deficits,   Gordon.AT,PERRAL Supple Neck,No JVD, No cervical lymphadenopathy appriciated.  Symmetrical Chest wall movement, Good air movement bilaterally, Coarse B. sounds RRR,No Gallops,Rubs or new Murmurs, No Parasternal Heave +ve B.Sounds, Abd Soft but ++ distended, No tenderness, No organomegaly appriciated, No rebound - guarding or rigidity. No Cyanosis, Clubbing , 2+  edema, No new Rash or bruise       Data Review:    CBC  Recent Labs Lab 08/10/2015 1309  WBC 9.2  HGB 13.3  HCT 38.0*  PLT 152  MCV 105.8*  MCH 37.0*  MCHC 35.0  RDW 15.5  LYMPHSABS 2.2  MONOABS 0.8  EOSABS 0.1  BASOSABS 0.0    Chemistries   Recent Labs Lab 08/07/2015 1309  NA 130*  K 5.1  CL 92*  CO2 32  GLUCOSE 111*  BUN 14  CREATININE 0.50*  CALCIUM 8.5*  AST 68*  ALT 31  ALKPHOS 86  BILITOT 1.7*   ------------------------------------------------------------------------------------------------------------------ No results  for input(s): CHOL, HDL, LDLCALC, TRIG, CHOLHDL, LDLDIRECT in the last 72 hours.  No results found for: HGBA1C ------------------------------------------------------------------------------------------------------------------  Recent Labs  07/13/2015 1309  TSH 4.138   ------------------------------------------------------------------------------------------------------------------ No results for input(s): VITAMINB12, FOLATE, FERRITIN, TIBC, IRON, RETICCTPCT in the last 72 hours.  Coagulation profile  Recent Labs Lab 07/21/2015 1309  INR 1.52*    No results for input(s): DDIMER in the last 72 hours.  Cardiac Enzymes  Recent Labs Lab 07/15/2015 1309  TROPONINI <0.03   ------------------------------------------------------------------------------------------------------------------    Component Value Date/Time   BNP 222.0* 08/01/2015 1309    Micro Results Recent Results (from the past 240 hour(s))  Culture, body fluid-bottle     Status: None (Preliminary result)   Collection Time: 08/05/2015  4:20 PM  Result Value Ref Range Status   Specimen Description ASCITIC  Final   Special Requests BOTTLES DRAWN AEROBIC AND ANAEROBIC 12CC EACH  Final   Culture NO GROWTH < 24 HOURS  Final   Report Status PENDING  Incomplete    Radiology Reports Dg Chest 2 View  07/29/2015  CLINICAL DATA:  Chronic cough EXAM: CHEST  2 VIEW COMPARISON:  None. FINDINGS: There is a focal opacity arising posteriorly in the region of the  superior segment of the right lower lobe measuring 7.3 x 7.1 x 4.4 cm. There is an ill-defined area of airspace opacity in anterior segment of the right upper lobe. Lungs elsewhere clear. Heart size and pulmonary vascularity are normal. No adenopathy. There is atherosclerotic calcification in the aorta. No bone lesions. IMPRESSION: Opacity in the superior segment right lower lobe posteriorly, concerning for parenchymal lung mass, measuring 7.3 x 7.1 x 4.4 cm. Advise contrast  enhanced chest CT to further assess. Ill-defined opacity anterior segment right upper lobe, concerning for focal pneumonia. Lungs elsewhere clear. Cardiac silhouette within normal limits. Aortic atherosclerosis. Electronically Signed   By: Lowella Grip III M.D.   On: 08/10/2015 13:26   Ct Angio Chest Pe W/cm &/or Wo Cm  07/21/2015  ADDENDUM REPORT: 07/16/2015 18:40 ADDENDUM: The large peripheral mass in the right lower lobe abutting the pleura is highly concerning for a primary pulmonary malignancy. Electronically Signed   By: Dorise Bullion III M.D   On: 07/14/2015 18:40  07/28/2015  CLINICAL DATA:  Possible lung mass on chest x-ray.  Ascites. EXAM: CT ANGIOGRAPHY CHEST WITH CONTRAST TECHNIQUE: Multidetector CT imaging of the chest was performed using the standard protocol during bolus administration of intravenous contrast. Multiplanar CT image reconstructions and MIPs were obtained to evaluate the vascular anatomy. CONTRAST:  100 mL of Isovue 370 COMPARISON:  Chest x-ray from earlier today FINDINGS: Debris in the trachea could represent significant mucus or aspiration. Recommend clinical correlation. The debris extends into the right mainstem bronchus is well. No pneumothorax. There is a low-density mass abutting the pleura in the right posterior inferior lung measuring 9 by 5 by 7.6 cm. There is underlying pleural thickening. There is irregularity in the underlying posterior right seventh rib. There appears to be lucency extending all even this rib consistent with a nondisplaced fracture. There is a small cavitated nodule in the right upper lobe on coronal image 105 and axial image 37 measuring 8 mm. There is a nodule between the heart in the liver on series 6, image 70 measuring 2.1 cm. It is unclear whether this is in the lung or mediastinum but favored to be pulmonary. Another nodule is seen in the medial left lower lobe on series 6, image 84 measuring greater than a cm. A tiny nodule seen in the  lateral left lung measuring 3 or 4 mm on image 67 of series 6. A final nodule is seen in the left lung base on series 4, image 77. No other suspicious nodules or masses. Adenopathy is identified in the mediastinum and right hilum. An enlarged right paratracheal node on series 4, image 39 measures 2.7 by 3.3 cm. There are shotty nodes in the AP window which are nonspecific but could be early metastatic disease. No left-sided pleural effusion or pericardial effusion. There are coronary artery calcifications. The heart demonstrates no acute abnormalities. Evaluation of the thoracic aorta is limited due to motion but no aneurysm or dissection is identified. No pulmonary emboli. Marked ascites is seen in the upper abdomen. A cirrhotic liver is noted. Further evaluation of the upper abdomen multi described on the abdominal CT from today. No other acute bony abnormalities. Review of the MIP images confirms the above findings. IMPRESSION: 1. No pulmonary emboli. 2. There is a large peripheral mass in the right lower lobe with underlying pleural thickening. There appears to be an underlying rib fracture as described above but this may be pathologic with some irregularity of the cortex. Recommend clinical correlation. 3.  Multiple pulmonary nodules suspicious for metastases. 4. Adenopathy in the mediastinum and right hilum consistent with metastatic disease. 5. Cirrhosis and ascites. The abdomen will be better described on the dedicated CT of the abdomen from today. 6. Significant debris in the trachea and right mainstem bronchus may represent aspiration. Electronically Signed: By: Dorise Bullion III M.D On: 08/07/2015 18:23   Ct Abdomen Pelvis W Contrast  08/01/2015  CLINICAL DATA:  Ascites and cirrhosis EXAM: CT ABDOMEN AND PELVIS WITH CONTRAST TECHNIQUE: Multidetector CT imaging of the abdomen and pelvis was performed using the standard protocol following bolus administration of intravenous contrast. CONTRAST:  100 mL  of Isovue 370 COMPARISON:  None. FINDINGS: Again noted is a mass in the periphery of the right lower lobe with cortical irregularity and a nondisplaced fracture in an underlying rib. Other pulmonary nodules in the lung bases were better described on the CT of the chest. Coronary artery calcifications are noted. Suspicious adenopathy in the subcarinal region. No free air. There is severe ascites diffusely throughout the abdomen. There is a nodular shrunken liver consistent cirrhosis. A low-attenuation lesion at the dome with probably a cyst. Another tiny low-attenuation lesion in the left hepatic lobe on image 23 of series 12 is too small to characterize but may simply represent a tiny cyst. No other suspicious hepatic masses are noted. Cholelithiasis is identified. The spleen is not enlarged by CT criteria. Adrenal glands are unremarkable. Scattered small renal cysts are noted. The pancreas is normal. Atherosclerosis is seen in the non aneurysmal aorta. Shotty nodes in the upper abdomen may be reactive with no adenopathy by CT criteria. The portal vein is unremarkable. Evaluation the bowel is limited due to the lack of oral contrast. Within this limitation, the stomach and small bowel are normal. The colon is also poorly evaluated with no acute colonic abnormalities. Visualized portions of the appendix are normal. The pelvis demonstrates shotty prominent nodes in the inguinal regions, probably reactive. No adenopathy in the pelvis. The bladder is normal. A large left hydrocele is identified, extending through a left inguinal hernia. No other abnormalities seen in the pelvis. Delayed imaging through the upper abdomen demonstrates no filling defects in the upper renal collecting systems. Other than the abnormal rib underlying the right lower lobe mass, no other bony abnormalities are noted. IMPRESSION: 1. Again noted is a mass in the right lower lobe highly concerning for a primary pulmonary malignancy. The nodules in  the lung bases are consistent with metastatic disease. Metastatic adenopathy to the mediastinum. There is a fracture in the rib underlying the primary mass. However, the associated cortical irregularity suggests the possibility of a pathologic fracture. 2. Cirrhosis in the liver. 3. Severe ascites. 4. Atherosclerosis in the abdominal aorta. Electronically Signed   By: Dorise Bullion III M.D   On: 07/31/2015 18:36   US Abdomen Limited  08/11/2015  CLINICAL DATA:  Abdominal swelling EXAM: LIMITED ABDOMEN ULTRASOUND FOR ASCITES TECHNIQUE: Limited ultrasound survey for ascites was performed in all four abdominal quadrants. COMPARISON:  None. FINDINGS: Large volume abdominal ascites. Diminutive size the liver with a slightly nodular contour partially visualized. IMPRESSION: Large volume ascites. Electronically Signed   By: Kathreen Devoid   On: 08/09/2015 16:36   US Paracentesis  08/06/2015  INDICATION: Ascites. EXAM: ULTRASOUND GUIDED PARACENTESIS MEDICATIONS: None. COMPLICATIONS: None immediate. PROCEDURE: Informed written consent was obtained from the patient after a discussion of the risks, benefits and alternatives to treatment. A timeout was performed prior to the initiation of  the procedure. Initial ultrasound scanning demonstrates a large amount of ascites within the left lower abdominal quadrant. The right lower abdomen was prepped and draped in the usual sterile fashion. 1% lidocaine with epinephrine was used for local anesthesia. Following this, a 6 French catheter was introduced. An ultrasound image was saved for documentation purposes. The paracentesis was performed. The catheter was removed and a dressing was applied. The patient tolerated the procedure well without immediate post procedural complication. Patient transferred back to the emergency room in stable condition. Postprocedural orders verbally given to the emergency room nurse time following the procedure. FINDINGS: A total of approximately 2.1  L of clear yellow fluid was removed. Samples were sent to the laboratory as requested by the clinical team. IMPRESSION: Successful ultrasound-guided paracentesis yielding 2.1 L liters of peritoneal fluid. Electronically Signed   By: Marcello Moores  Register   On: 07/25/2015 17:09    Time Spent in minutes  30   Lala Lund K M.D on 13-Aug-2015 at 10:44 AM  Between 7am to 7pm - Pager - [phone]  After 7pm go to [email or web address] - password [facility]  [facility] -  Office  [phone]

## 2015-08-13 DEATH — deceased

## 2017-11-19 IMAGING — CT CT ABD-PELV W/ CM
3 of 6 series · 14 of 32 positions shown, 18 images · IV contrast (ISOVUE)
Comparison: None.

CLINICAL DATA: Ascites and cirrhosis

EXAM:
CT ABDOMEN AND PELVIS WITH CONTRAST
TECHNIQUE: Multidetector CT imaging of the abdomen and pelvis was performed
using the standard protocol following bolus administration of
intravenous contrast.
CONTRAST:  100 mL of Isovue 370

[Series 5: pe thins 1.5 · axial · 0.76mm/px · z∈[+970,+1151]mm · 7 of 217 slices shown]
[im 22/217  soft-tissue]
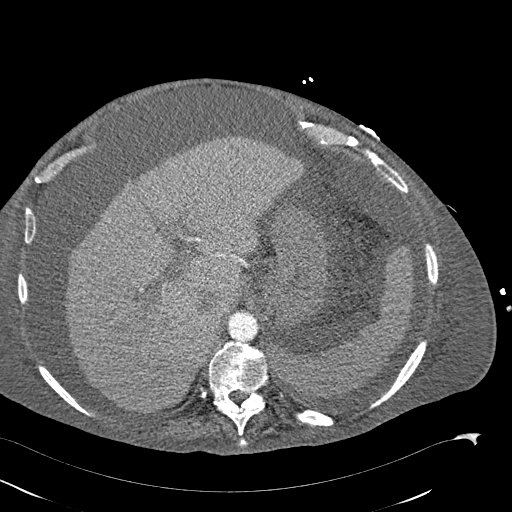
[im 44/217  soft-tissue]
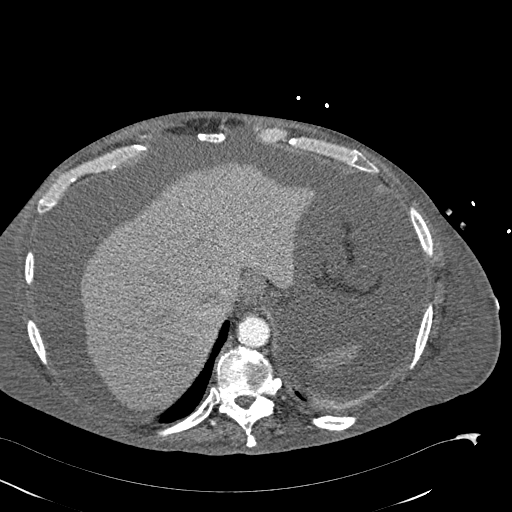
[im 65/217  soft-tissue]
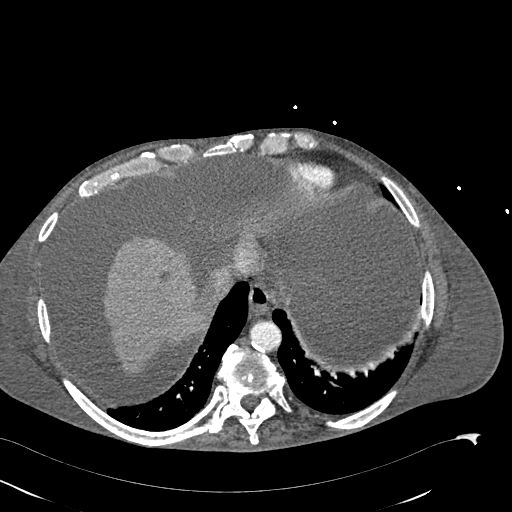
[im 87/217  soft-tissue]
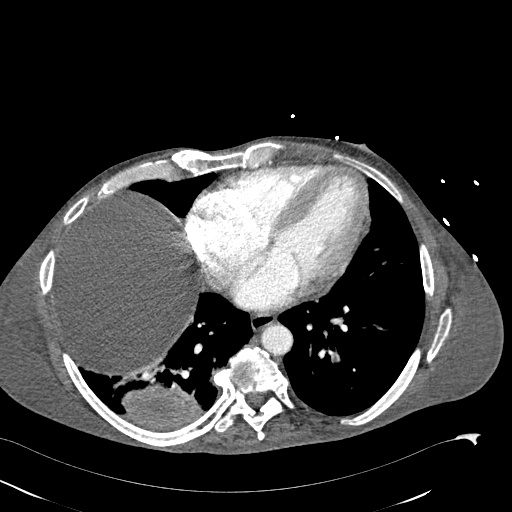
[im 130/217  soft-tissue]
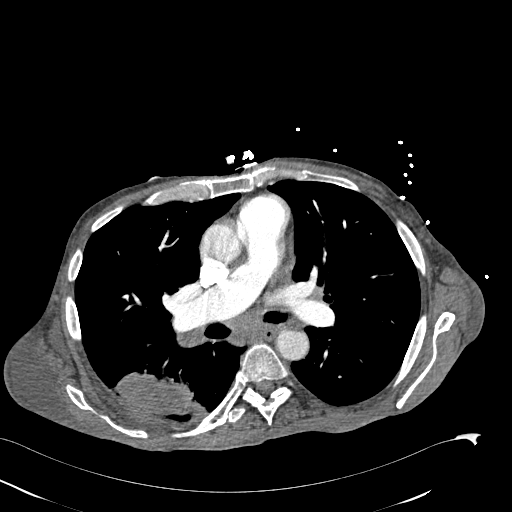
[im 152/217  soft-tissue]
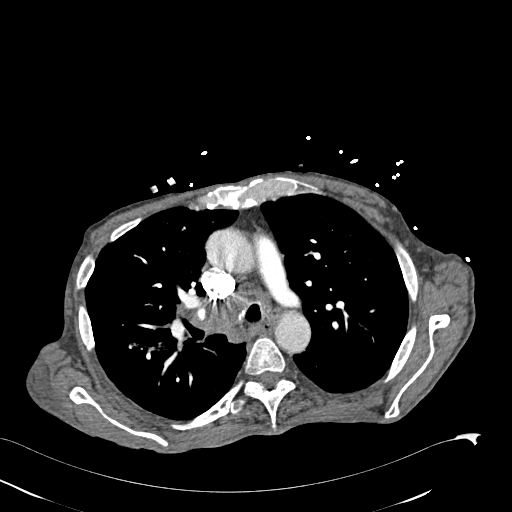
[im 173/217  soft-tissue]
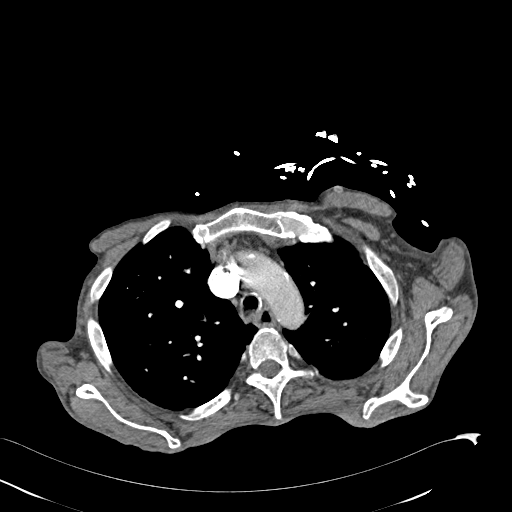

[Series 12: routine abd pel with · axial · 0.84mm/px · z∈[+652,+982]mm · 4 of 110 slices shown]
[im 22/110  soft-tissue]
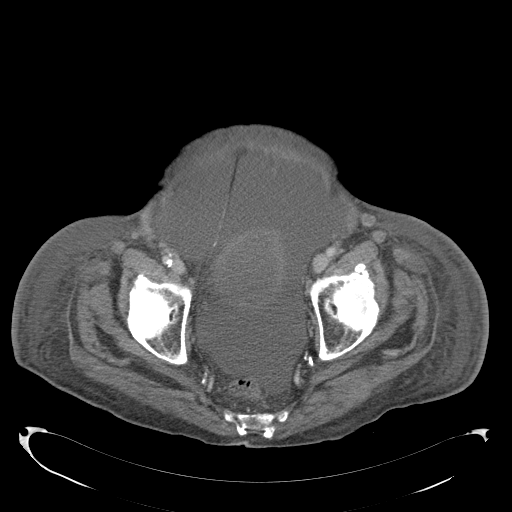
[im 44/110  soft-tissue]
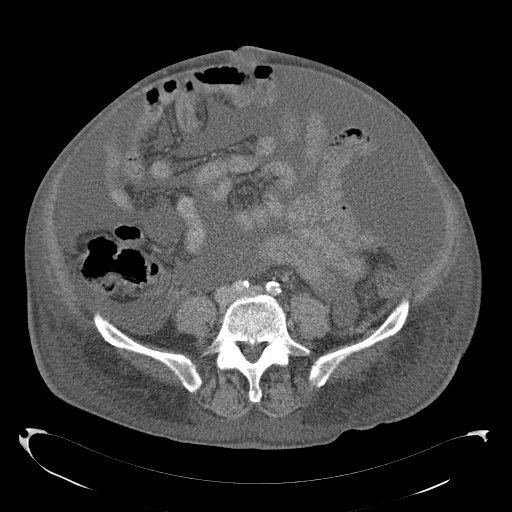
[im 66/110  soft-tissue]
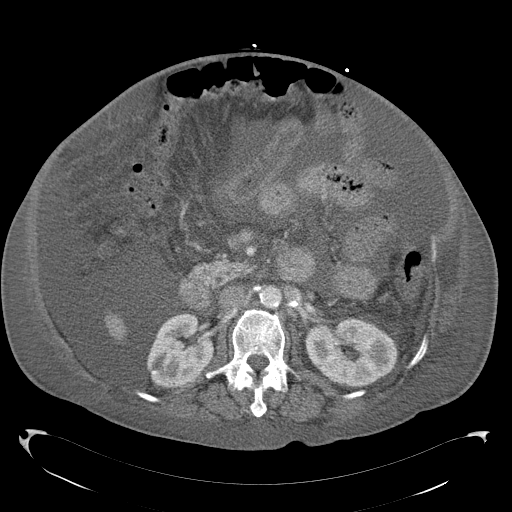
[im 88/110  soft-tissue]
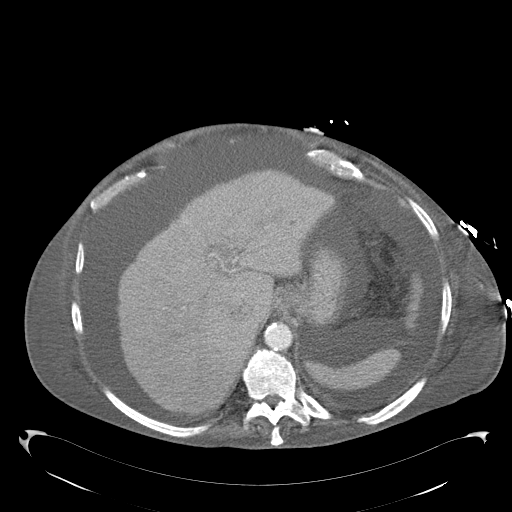

[Series 16: portal thins · axial · portal-venous · 0.84mm/px · z∈[+1005,+1063]mm · 3 of 96 slices shown, 7 images]
[im 24/96  soft-tissue]
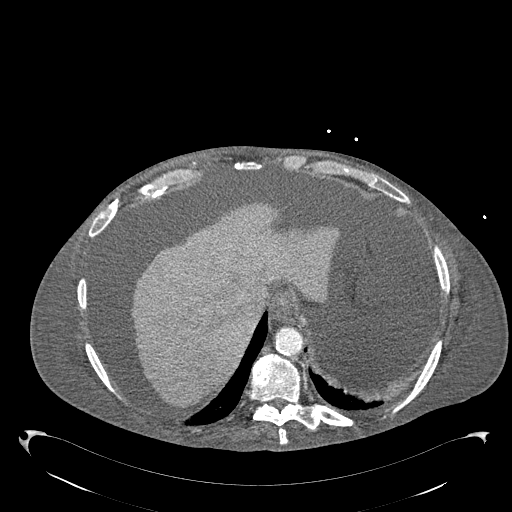
[im 24/96  lung]
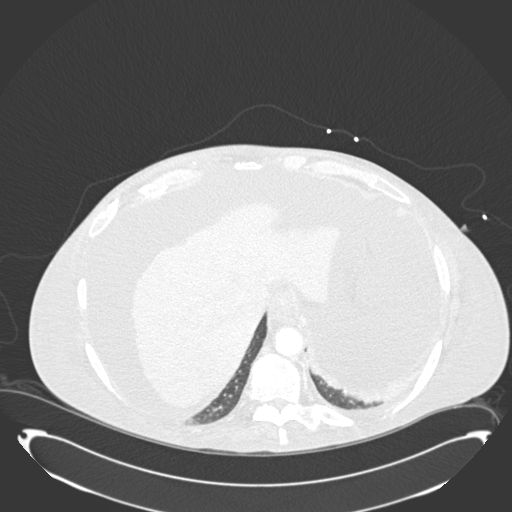
[im 24/96  bone]
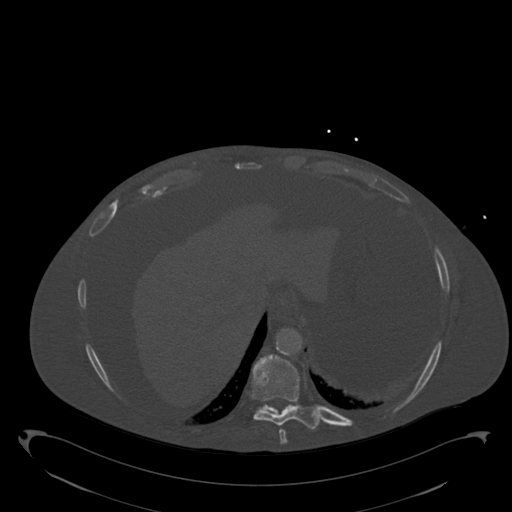
[im 48/96  soft-tissue]
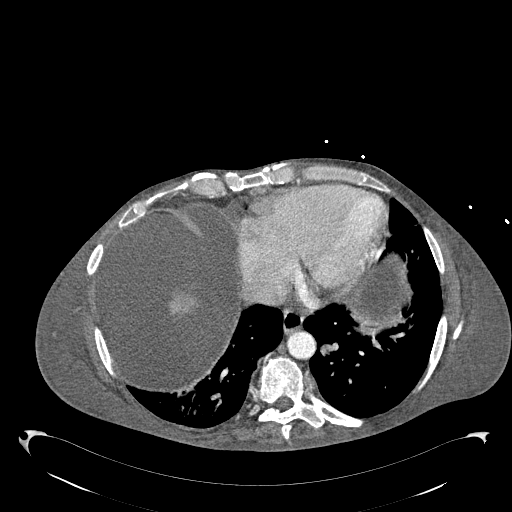
[im 48/96  lung]
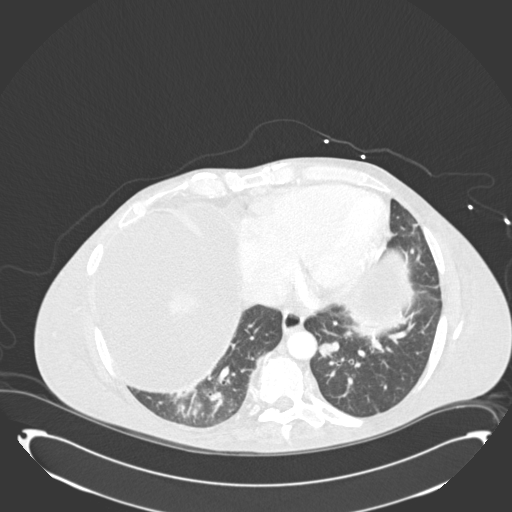
[im 72/96  soft-tissue]
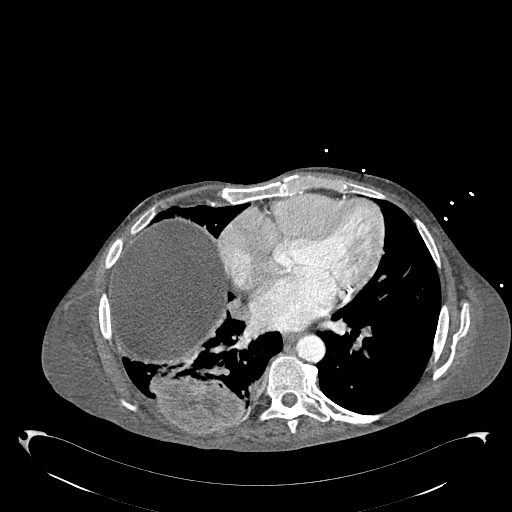
[im 72/96  lung]
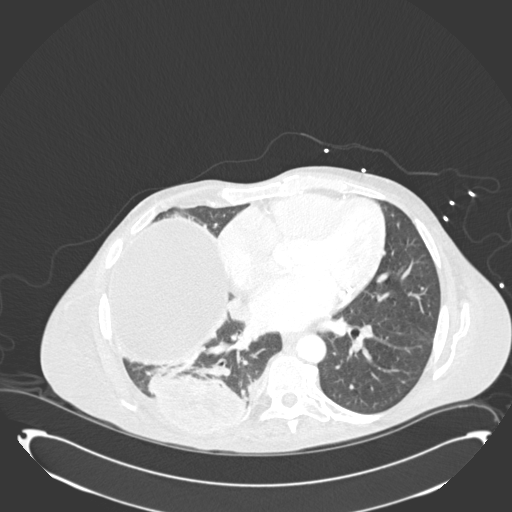

[14 of 32 positions shown; findings below may reference images not displayed]

FINDINGS: Again noted is a mass in the periphery of the right lower lobe with
cortical irregularity and a nondisplaced fracture in an underlying
rib. Other pulmonary nodules in the lung bases were better described
on the CT of the chest. Coronary artery calcifications are noted.
Suspicious adenopathy in the subcarinal region.

No free air. There is severe ascites diffusely throughout the
abdomen. There is a nodular shrunken liver consistent cirrhosis. A
low-attenuation lesion at the dome with probably a cyst. Another
tiny low-attenuation lesion in the left hepatic lobe on image 23 of
series 12 is too small to characterize but may simply represent a
tiny cyst. No other suspicious hepatic masses are noted.
Cholelithiasis is identified. The spleen is not enlarged by CT
criteria. Adrenal glands are unremarkable. Scattered small renal
cysts are noted. The pancreas is normal. Atherosclerosis is seen in
the non aneurysmal aorta. Shotty nodes in the upper abdomen may be
reactive with no adenopathy by CT criteria. The portal vein is
unremarkable. Evaluation the bowel is limited due to the lack of
oral contrast. Within this limitation, the stomach and small bowel
are normal. The colon is also poorly evaluated with no acute colonic
abnormalities. Visualized portions of the appendix are normal.

The pelvis demonstrates shotty prominent nodes in the inguinal
regions, probably reactive. No adenopathy in the pelvis. The bladder
is normal. A large left hydrocele is identified, extending through a
left inguinal hernia. No other abnormalities seen in the pelvis.

Delayed imaging through the upper abdomen demonstrates no filling
defects in the upper renal collecting systems. Other than the
abnormal rib underlying the right lower lobe mass, no other bony
abnormalities are noted.
IMPRESSION: 1. Again noted is a mass in the right lower lobe highly concerning
for a primary pulmonary malignancy. The nodules in the lung bases
are consistent with metastatic disease. Metastatic adenopathy to the
mediastinum. There is a fracture in the rib underlying the primary
mass. However, the associated cortical irregularity suggests the
possibility of a pathologic fracture.
2. Cirrhosis in the liver.
3. Severe ascites.
4. Atherosclerosis in the abdominal aorta.

## 2017-11-19 IMAGING — DX DG CHEST 2V
2 series · 2 of 2 positions shown · non-contrast
Comparison: None.

CLINICAL DATA: Chronic cough

EXAM:
CHEST  2 VIEW

[chest lat]
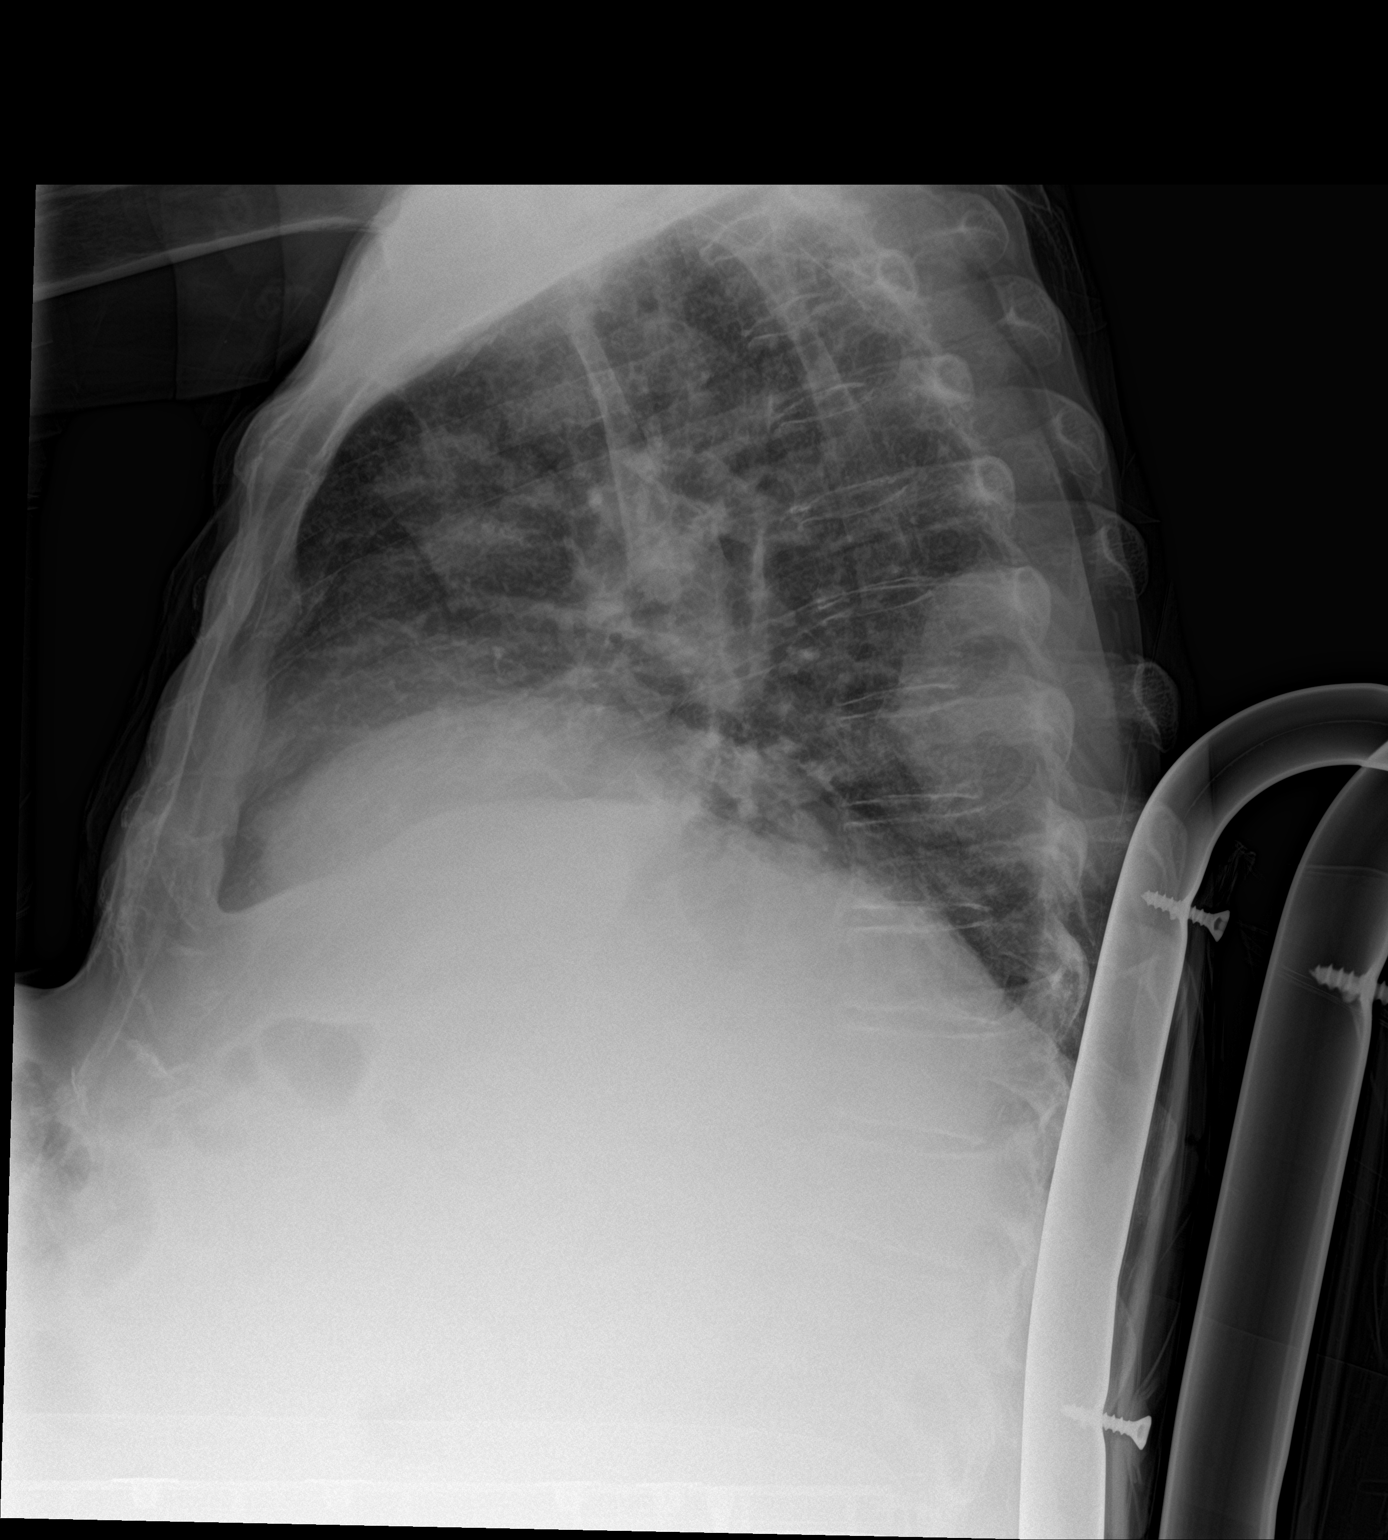

[chest ap]
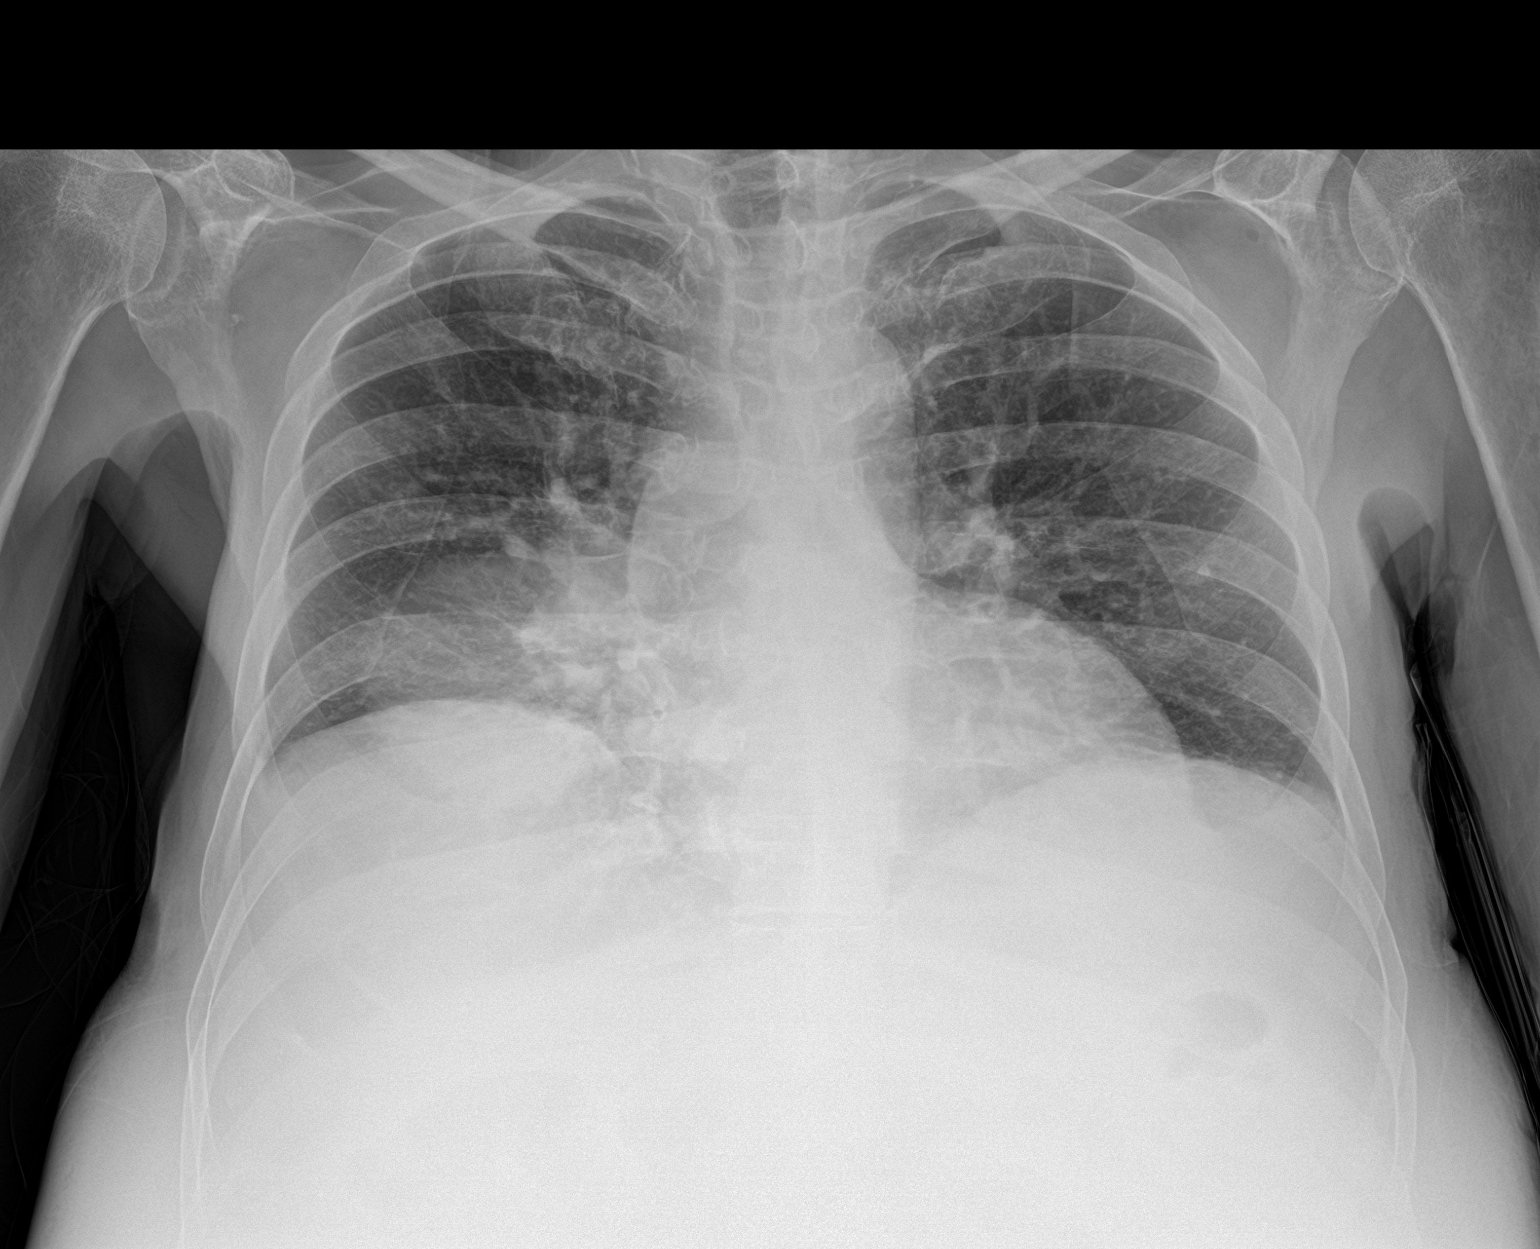

[2 of 2 positions shown; findings below may reference images not displayed]

FINDINGS: There is a focal opacity arising posteriorly in the region of the
superior segment of the right lower lobe measuring 7.3 x 7.1 x
cm. There is an ill-defined area of airspace opacity in anterior
segment of the right upper lobe. Lungs elsewhere clear. Heart size
and pulmonary vascularity are normal. No adenopathy. There is
atherosclerotic calcification in the aorta. No bone lesions.
IMPRESSION: Opacity in the superior segment right lower lobe posteriorly,
concerning for parenchymal lung mass, measuring 7.3 x 7.1 x 4.4 cm.
Advise contrast enhanced chest CT to further assess.

Ill-defined opacity anterior segment right upper lobe, concerning
for focal pneumonia.

Lungs elsewhere clear. Cardiac silhouette within normal limits.
Aortic atherosclerosis.
